# Patient Record
Sex: Male | Born: 1970 | Hispanic: No | Smoking: Current every day smoker
Health system: Southern US, Community
[De-identification: ages and names within clinical notes are randomized; demographics above are authoritative.]

## PROBLEM LIST (undated history)

## (undated) DIAGNOSIS — Q8500 Neurofibromatosis, unspecified: Secondary | ICD-10-CM

## (undated) DIAGNOSIS — F1010 Alcohol abuse, uncomplicated: Secondary | ICD-10-CM

## (undated) DIAGNOSIS — Q8501 Neurofibromatosis, type 1: Secondary | ICD-10-CM

## (undated) DIAGNOSIS — G4730 Sleep apnea, unspecified: Secondary | ICD-10-CM

## (undated) DIAGNOSIS — F8081 Childhood onset fluency disorder: Secondary | ICD-10-CM

## (undated) HISTORY — PX: CARPAL TUNNEL RELEASE: SHX101

---

## 2013-12-03 ENCOUNTER — Ambulatory Visit: Payer: Self-pay | Attending: Family Medicine

## 2014-12-19 ENCOUNTER — Encounter (HOSPITAL_COMMUNITY): Payer: Self-pay

## 2014-12-19 ENCOUNTER — Emergency Department (HOSPITAL_COMMUNITY): Payer: Self-pay

## 2014-12-19 ENCOUNTER — Emergency Department (HOSPITAL_COMMUNITY)
Admission: EM | Admit: 2014-12-19 | Discharge: 2014-12-19 | Disposition: A | Discharge status: Home / Self Care | Payer: Self-pay | Attending: Emergency Medicine | Admitting: Emergency Medicine

## 2014-12-19 DIAGNOSIS — R1084 Generalized abdominal pain: Secondary | ICD-10-CM | POA: Insufficient documentation

## 2014-12-19 DIAGNOSIS — B349 Viral infection, unspecified: Secondary | ICD-10-CM | POA: Insufficient documentation

## 2014-12-19 DIAGNOSIS — Q8501 Neurofibromatosis, type 1: Secondary | ICD-10-CM | POA: Insufficient documentation

## 2014-12-19 HISTORY — DX: Neurofibromatosis, type 1: Q85.01

## 2014-12-19 LAB — COMPREHENSIVE METABOLIC PANEL
ALT: 20 U/L (ref 17–63)
AST: 27 U/L (ref 15–41)
Albumin: 3.6 g/dL (ref 3.5–5.0)
Alkaline Phosphatase: 56 U/L (ref 38–126)
Anion gap: 10 (ref 5–15)
BUN: 10 mg/dL (ref 6–20)
CO2: 28 mmol/L (ref 22–32)
Calcium: 8.4 mg/dL — ABNORMAL LOW (ref 8.9–10.3)
Chloride: 101 mmol/L (ref 101–111)
Creatinine, Ser: 0.68 mg/dL (ref 0.61–1.24)
GFR calc Af Amer: >60 mL/min (ref >60)
GFR calc non Af Amer: >60 mL/min (ref >60)
Glucose, Bld: 114 mg/dL — ABNORMAL HIGH (ref 65–99)
Potassium: 3.4 mmol/L — ABNORMAL LOW (ref 3.5–5.1)
Sodium: 139 mmol/L (ref 135–145)
Total Bilirubin: 0.7 mg/dL (ref 0.3–1.2)
Total Protein: 6.2 g/dL — ABNORMAL LOW (ref 6.5–8.1)

## 2014-12-19 LAB — CBC WITH DIFFERENTIAL/PLATELET
Basophils Absolute: 0 10*3/uL (ref 0.0–0.1)
Basophils Relative: 1 %
Eosinophils Absolute: 0 10*3/uL (ref 0.0–0.7)
Eosinophils Relative: 1 %
HCT: 44.4 % (ref 39.0–52.0)
Hemoglobin: 15.8 g/dL (ref 13.0–17.0)
Lymphocytes Relative: 29 %
Lymphs Abs: 1.4 10*3/uL (ref 0.7–4.0)
MCH: 32.8 pg (ref 26.0–34.0)
MCHC: 35.6 g/dL (ref 30.0–36.0)
MCV: 92.3 fL (ref 78.0–100.0)
Monocytes Absolute: 0.7 10*3/uL (ref 0.1–1.0)
Monocytes Relative: 16 %
Neutro Abs: 2.5 10*3/uL (ref 1.7–7.7)
Neutrophils Relative %: 53 %
Platelets: 229 10*3/uL (ref 150–400)
RBC: 4.81 MIL/uL (ref 4.22–5.81)
RDW: 13 % (ref 11.5–15.5)
WBC: 4.7 10*3/uL (ref 4.0–10.5)

## 2014-12-19 LAB — URINALYSIS, ROUTINE W REFLEX MICROSCOPIC
Bilirubin Urine: NEGATIVE
Glucose, UA: NEGATIVE mg/dL
Hgb urine dipstick: NEGATIVE
Ketones, ur: NEGATIVE mg/dL
Leukocytes, UA: NEGATIVE
Nitrite: NEGATIVE
Protein, ur: 30 mg/dL — AB
Specific Gravity, Urine: 1.016 (ref 1.005–1.030)
pH: 8.5 — ABNORMAL HIGH (ref 5.0–8.0)

## 2014-12-19 LAB — URINE MICROSCOPIC-ADD ON
RBC / HPF: NONE SEEN RBC/hpf (ref 0–5)
Squamous Epithelial / LPF: NONE SEEN
WBC, UA: NONE SEEN WBC/hpf (ref 0–5)

## 2014-12-19 LAB — RAPID URINE DRUG SCREEN, HOSP PERFORMED
Amphetamines: NOT DETECTED
Barbiturates: NOT DETECTED
Benzodiazepines: NOT DETECTED
Cocaine: NOT DETECTED
Opiates: NOT DETECTED
Tetrahydrocannabinol: NOT DETECTED

## 2014-12-19 LAB — I-STAT TROPONIN, ED: Troponin i, poc: 0 ng/mL (ref 0.00–0.08)

## 2014-12-19 LAB — LIPASE, BLOOD: Lipase: 26 U/L (ref 11–51)

## 2014-12-19 MED ORDER — ONDANSETRON HCL 4 MG PO TABS
4.0000 mg | ORAL_TABLET | Freq: Once | ORAL | Status: AC
  Administered 2014-12-19: 4 mg via ORAL
  Filled 2014-12-19: qty 1

## 2014-12-19 MED ORDER — SODIUM CHLORIDE 0.9 % IV BOLUS (SEPSIS)
1000.0000 mL | Freq: Once | INTRAVENOUS | Status: AC
  Administered 2014-12-19: 1000 mL via INTRAVENOUS

## 2014-12-19 MED ORDER — KETOROLAC TROMETHAMINE 15 MG/ML IJ SOLN
15.0000 mg | Freq: Once | INTRAMUSCULAR | Status: AC
  Administered 2014-12-19: 15 mg via INTRAVENOUS
  Filled 2014-12-19: qty 1

## 2014-12-19 MED ORDER — ONDANSETRON HCL 4 MG PO TABS: 4.0000 mg | ORAL_TABLET | Freq: Four times a day (QID) | ORAL | Status: DC

## 2014-12-19 NOTE — ED Notes (Signed)
He c/o cough/intermittent fever, plus occasional n/v/d x 2-3 days.  He arrives in no distress.  He told EMS he drank 1 1/2 "Four Loco" drinks today.  He also c/o body aches, including chest discomfort which is worse with cough.

## 2014-12-19 NOTE — ED Provider Notes (Signed)
CSN: [id number]     Arrival date & time 12/19/14  1642 History   First MD Initiated Contact with Patient 12/19/14 1645     Chief Complaint  Patient presents with  . Cough  . Diarrhea  . Emesis   HPI   44 year old male presents today with numerous complaints. Patient reports that for the last week he's had a nonproductive cough that has persisted. He reports over the last 2 days he's had several episodes of nonbloody and nonprojectile vomiting and diarrhea with subjective fever and chills. He reports generalized abdominal pain. Patient notes that he has been unable to tolerate by mouth but shortly after reports that he had alcohol today and was able to hold that down. Patient additionally notes chest soreness located in the central aspect of his chest that started off very minor and has progressed with associated coughing. He reports that it is centralized with no radiation of symptoms. He reports that he has a history of hypertension, denies smoking, her history, hyperlipidemia, any significant family cardiac history. Patient reports using Tylenol PM with no improvement and chest pain. He reports it's worse with cough, tenderness to palpation.  Past Medical History  Diagnosis Date  . 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 Carpal tunnel release     No family history on file. Social History  Substance Use Topics  . Smoking status: Never Smoker   . Smokeless tobacco: None  . Alcohol Use: Yes    Review of Systems  All other systems reviewed and are negative.   Allergies  Review of patient's allergies indicates no known allergies.  Home Medications   Prior to Admission medications   Medication Sig Start Date End Date Taking? Authorizing Provider  Ibuprofen-Diphenhydramine Cit (ADVIL PM PO) Take 2 tablets by mouth at bedtime as needed (for sleep/pain).   Yes Historical Provider, MD  ondansetron (ZOFRAN) 4 MG tablet Take 1 tablet (4 mg  total) by mouth every 6 (six) hours. 12/19/14   Ingram Onnen, PA-C   BP 144/104 mmHg  Pulse 94  Temp(Src) 97.7 F (36.5 C) (Oral)  Resp 16  SpO2 96%   Physical Exam  Constitutional: He is oriented to person, place, and time. He appears well-developed and well-nourished.  HENT:  Head: Normocephalic and atraumatic.  Eyes: Conjunctivae are normal. Pupils are equal, round, and reactive to light. Right eye exhibits no discharge. Left eye exhibits no discharge. No scleral icterus.  Neck: Normal range of motion. No JVD present. No tracheal deviation present.  Cardiovascular: Regular rhythm, normal heart sounds and intact distal pulses.  Exam reveals no gallop and no friction rub.   No murmur heard. Pulmonary/Chest: Effort normal and breath sounds normal. No stridor. No respiratory distress. He has no wheezes. He has no rales. He exhibits no tenderness.  Abdominal: Soft. Bowel sounds are normal. He exhibits no distension and no mass. There is tenderness. There is no rebound and no guarding.  Generalized abdominal tenderness no focal findings  Musculoskeletal: Normal range of motion. He exhibits no edema or tenderness.  Neurological: He is alert and oriented to person, place, and time. Coordination normal.  Skin: Skin is warm and dry. No rash noted. No erythema. No pallor.  Psychiatric: He has a normal mood and affect. His behavior is normal. Judgment and thought content normal.  Nursing note and vitals reviewed.   ED Course  Procedures (including critical care time) Labs Review Labs Reviewed  COMPREHENSIVE METABOLIC  PANEL - Abnormal; Notable for the following:    Potassium 3.4 (*)    Glucose, Bld 114 (*)    Calcium 8.4 (*)    Total Protein 6.2 (*)    All other components within normal limits  URINALYSIS, ROUTINE W REFLEX MICROSCOPIC (NOT AT [facility]) - Abnormal; Notable for the following:    APPearance TURBID (*)    pH 8.5 (*)    Protein, ur 30 (*)    All other components within normal  limits  URINE MICROSCOPIC-ADD ON - Abnormal; Notable for the following:    Bacteria, UA FEW (*)    All other components within normal limits  CBC WITH DIFFERENTIAL/PLATELET  LIPASE, BLOOD  URINE RAPID DRUG SCREEN, HOSP PERFORMED  I-STAT TROPOININ, ED    Imaging Review Dg Chest 2 View  12/19/2014  CLINICAL DATA:  Cough.  Chest pain.  Symptoms for 2 days. EXAM: CHEST  2 VIEW COMPARISON:  None. FINDINGS: The cardiomediastinal contours are normal. Lung volumes are low with mild bibasilar atelectasis. Pulmonary vasculature is normal. No consolidation, pleural effusion, or pneumothorax. No acute osseous abnormalities are seen. IMPRESSION: Hypoventilatory chest with bibasilar atelectasis. Electronically Signed   By: Rubye OaksMelanie  Ehinger M.D.   On: 12/19/2014 17:58   I have personally reviewed and evaluated these images and lab results as part of my medical decision-making.   EKG Interpretation   Date/Time:  Saturday December 19 2014 16:54:13 EST Ventricular Rate:  94 PR Interval:  152 QRS Duration: 91 QT Interval:  368 QTC Calculation: 460 R Axis:   11 Text Interpretation:  Sinus rhythm Probable left atrial enlargement No  previous tracing Confirmed by Anitra LauthPLUNKETT  MD, Alphonzo LemmingsWHITNEY ([id number]) on 12/19/2014  5:05:10 PM      MDM   Final diagnoses:  Viral illness    Labs: I-STAT troponin, CBC, CMP, lipase, urinalysis, urine rapid drug screen- no significant findings  Imaging: ED EKG, DG chest 2 view- no significant findings Consults:  Therapeutics: Normal saline, Zofran-   Discharge Meds:   Assessment/Plan: Patient presents with likely viral illness. He is afebrile, nontoxic with reassuring vital signs and laboratory data. Patient is tolerating by mouth as evidenced by his ability to drink alcohol today without vomiting. He discharged home with antinausea medication and strict return precautions fall with primary in 3 days for reevaluation. Patient verbalizes understanding and agreement with  today's plan and had no further questions or concerns at time of discharge.        Eyvonne MechanicJeffrey Machael Raine, PA-C 12/19/14 [id number]  Gwyneth SproutWhitney Plunkett, MD 12/20/14 [phone]

## 2014-12-19 NOTE — Discharge Instructions (Signed)
Viral Infections °A viral infection can be caused by different types of viruses. Most viral infections are not serious and resolve on their own. However, some infections may cause severe symptoms and may lead to further complications. °SYMPTOMS °Viruses can frequently cause: °· Minor sore throat. °· Aches and pains. °· Headaches. °· Runny nose. °· Different types of rashes. °· Watery eyes. °· Tiredness. °· Cough. °· Loss of appetite. °· Gastrointestinal infections, resulting in nausea, vomiting, and diarrhea. °These symptoms do not respond to antibiotics because the infection is not caused by bacteria. However, you might catch a bacterial infection following the viral infection. This is sometimes called a "superinfection." Symptoms of such a bacterial infection may include: °· Worsening sore throat with pus and difficulty swallowing. °· Swollen neck glands. °· Chills and a high or persistent fever. °· Severe headache. °· Tenderness over the sinuses. °· Persistent overall ill feeling (malaise), muscle aches, and tiredness (fatigue). °· Persistent cough. °· Yellow, green, or brown mucus production with coughing. °HOME CARE INSTRUCTIONS  °· Only take over-the-counter or prescription medicines for pain, discomfort, diarrhea, or fever as directed by your caregiver. °· Drink enough water and fluids to keep your urine clear or pale yellow. Sports drinks can provide valuable electrolytes, sugars, and hydration. °· Get plenty of rest and maintain proper nutrition. Soups and broths with crackers or rice are fine. °SEEK IMMEDIATE MEDICAL CARE IF:  °· You have severe headaches, shortness of breath, chest pain, neck pain, or an unusual rash. °· You have uncontrolled vomiting, diarrhea, or you are unable to keep down fluids. °· You or your child has an oral temperature above 102° F (38.9° C), not controlled by medicine. °· Your baby is older than 3 months with a rectal temperature of 102° F (38.9° C) or higher. °· Your baby is 3  months old or younger with a rectal temperature of 100.4° F (38° C) or higher. °MAKE SURE YOU:  °· Understand these instructions. °· Will watch your condition. °· Will get help right away if you are not doing well or get worse. °  °This information is not intended to replace advice given to you by your health care provider. Make sure you discuss any questions you have with your health care provider. °  °Document Released: 10/05/2004 Document Revised: 03/20/2011 Document Reviewed: 06/03/2014 °Elsevier Interactive Patient Education ©2016 Elsevier Inc. ° °

## 2014-12-19 NOTE — ED Notes (Signed)
Unable to collect labs PT taken to xray 

## 2014-12-19 NOTE — ED Notes (Signed)
Bed: [id number] Expected date: 12/19/14 Expected time: 4:41 PM Means of arrival: Ambulance Comments: N/V body aches

## 2015-07-17 ENCOUNTER — Encounter (HOSPITAL_COMMUNITY): Payer: Self-pay | Admitting: Emergency Medicine

## 2015-07-17 ENCOUNTER — Emergency Department (HOSPITAL_COMMUNITY)
Admission: EM | Admit: 2015-07-17 | Discharge: 2015-07-18 | Disposition: A | Discharge status: Home / Self Care | Payer: Self-pay | Attending: Emergency Medicine | Admitting: Emergency Medicine

## 2015-07-17 DIAGNOSIS — F1020 Alcohol dependence, uncomplicated: Secondary | ICD-10-CM | POA: Diagnosis present

## 2015-07-17 DIAGNOSIS — F322 Major depressive disorder, single episode, severe without psychotic features: Secondary | ICD-10-CM | POA: Diagnosis present

## 2015-07-17 DIAGNOSIS — Z5181 Encounter for therapeutic drug level monitoring: Secondary | ICD-10-CM | POA: Insufficient documentation

## 2015-07-17 DIAGNOSIS — F1094 Alcohol use, unspecified with alcohol-induced mood disorder: Secondary | ICD-10-CM | POA: Insufficient documentation

## 2015-07-17 HISTORY — DX: Sleep apnea, unspecified: G47.30

## 2015-07-17 LAB — COMPREHENSIVE METABOLIC PANEL
ALBUMIN: 3.9 g/dL (ref 3.5–5.0)
ALK PHOS: 61 U/L (ref 38–126)
ALT: 42 U/L (ref 17–63)
AST: 38 U/L (ref 15–41)
Anion gap: 8 (ref 5–15)
BILIRUBIN TOTAL: 0.1 mg/dL — AB (ref 0.3–1.2)
BUN: 8 mg/dL (ref 6–20)
CALCIUM: 8.5 mg/dL — AB (ref 8.9–10.3)
CO2: 21 mmol/L — ABNORMAL LOW (ref 22–32)
Chloride: 108 mmol/L (ref 101–111)
Creatinine, Ser: 0.83 mg/dL (ref 0.61–1.24)
GFR calc Af Amer: >60 mL/min (ref >60)
GFR calc non Af Amer: >60 mL/min (ref >60)
GLUCOSE: 108 mg/dL — AB (ref 65–99)
Potassium: 3.5 mmol/L (ref 3.5–5.1)
Sodium: 137 mmol/L (ref 135–145)
TOTAL PROTEIN: 6.5 g/dL (ref 6.5–8.1)

## 2015-07-17 LAB — CBC
HEMATOCRIT: 50.4 % (ref 39.0–52.0)
Hemoglobin: 17.8 g/dL — ABNORMAL HIGH (ref 13.0–17.0)
MCH: 30.6 pg (ref 26.0–34.0)
MCHC: 35.3 g/dL (ref 30.0–36.0)
MCV: 86.7 fL (ref 78.0–100.0)
Platelets: 332 10*3/uL (ref 150–400)
RBC: 5.81 MIL/uL (ref 4.22–5.81)
RDW: 14.2 % (ref 11.5–15.5)
WBC: 9.8 10*3/uL (ref 4.0–10.5)

## 2015-07-17 LAB — ACETAMINOPHEN LEVEL: Acetaminophen (Tylenol), Serum: <10 ug/mL — ABNORMAL LOW (ref 10–30)

## 2015-07-17 LAB — RAPID URINE DRUG SCREEN, HOSP PERFORMED
Amphetamines: NOT DETECTED
BARBITURATES: NOT DETECTED
BENZODIAZEPINES: NOT DETECTED
Cocaine: NOT DETECTED
Opiates: NOT DETECTED
TETRAHYDROCANNABINOL: NOT DETECTED

## 2015-07-17 LAB — ETHANOL: Alcohol, Ethyl (B): 386 mg/dL (ref <5)

## 2015-07-17 LAB — SALICYLATE LEVEL: Salicylate Lvl: <4 mg/dL (ref 2.8–30.0)

## 2015-07-17 MED ORDER — LORAZEPAM 1 MG PO TABS: 0.0000 mg | ORAL_TABLET | Freq: Two times a day (BID) | ORAL | Status: DC

## 2015-07-17 MED ORDER — LORAZEPAM 1 MG PO TABS
0.0000 mg | ORAL_TABLET | Freq: Four times a day (QID) | ORAL | Status: DC
  Administered 2015-07-18: 2 mg via ORAL
  Administered 2015-07-18: 1 mg via ORAL
  Filled 2015-07-17: qty 1
  Filled 2015-07-17: qty 2

## 2015-07-17 NOTE — BH Assessment (Addendum)
Tele Assessment Note   Levi Welch is an 45 y.o. single male who presents unaccompanied to [facility] ED voluntarily via Patent examinerlaw enforcement. Pt is very intoxicated, tearful and says "I tried to kill myself today." Pt reports he ingested eight tabs of Valerian root and drank an excessive amount of alcohol in a suicide attempt. Pt reports he filed a complaint against his landlord and is in the process of being evicted. Pt fears he will be homeless and says "I'm scared and ashamed." He states he has a long history of alcohol abuse. Pt reports symptoms including crying spells, social withdrawal, loss of interest in usual pleasures, fatigue, irritability, decreased concentration, decreased sleep, decreased appetite and feelings of guilt and hopelessness. Pt denies any previous suicide attempts. He denies any history of intentional self-injurious behaviors. He denies any history of psychotic symptoms.  Pt reports he has been abusing alcohol for years and is unable to maintain sobriety. He states he drinks approximately three 40-ounce bottles of "[employer]" daily. Pt denies other substance use. Pt reports a history of blackouts and withdrawal symptoms including tremors, sweats, nausea, vomiting, diarrhea and insomnia. He denies history of seizures. Pt's blood alcohol is 386 and his urine drug screen is negative.  Pt identifies his housing situation as his primary stressor. He says he currently rents a residence with three roommates. 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 He says he is "the black sheep of my family" and doesn't feel he has any support at this time. He says his mother abused alcohol and died when Pt was age 43eighteen of alcohol related causes. He says his father is 39seventy-five years old and still working and he has four siblings. Pt reports he [facility] and his most recent treatment was in 2015.  Pt is dressed in  hospital scrubs, alert, intoxicated, oriented x4. Pt has significant stutter. Motor behavior is slightly tremulous. Eye contact is good and Pt is very tearful. Pt's mood is depressed and affect is congruent with mood. Thought process is coherent and relevant. There is no indication Pt is currently responding to internal stimuli or experiencing delusional thought content. Pt was cooperative throughout assessment. He states he is willing to sign voluntarily into a psychiatric facility to address his alcohol use and depressed mood.   Diagnosis: Alcohol Use Disorder, Severe; Alcohol-induced Mood Disorder  Past Medical History:  Past Medical History  Diagnosis Date  . 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 Sleep apnea     Past Surgical History  Procedure Laterality Date  . Carpal tunnel release      Family History: No family history on file.  Social History:  reports that he has never smoked. He does not have any smokeless tobacco history on file. He reports that he drinks alcohol. He reports that he does not use illicit drugs.  Additional Social History:  Alcohol / Drug Use Pain Medications: Denies abuse Prescriptions: Denies abuse Over the Counter: Denies abuse History of alcohol / drug use?: Yes Longest period of sobriety (when/how long): Six days Negative Consequences of Use: Personal relationships Withdrawal Symptoms: Tremors, Sweats, Nausea / Vomiting, Blackouts, Diarrhea Substance #1 Name of Substance 1: Alcohol 1 - Age of First Use: 15 1 - Amount (size/oz): Approximately three 40-ounce "Ice House" 1 - Frequency: Daily  1 - Duration: Ongoing 1 - Last Use / Amount: 07/17/15, three 40-ounce "Ice House"  CIWA: CIWA-Ar BP: Marland Kitchen(!)  144/110 mmHg Pulse Rate: 88 Nausea and Vomiting: no nausea and no vomiting Tactile Disturbances: none Tremor: two Auditory Disturbances: not present Paroxysmal Sweats: no sweat visible Visual Disturbances: not present Anxiety: three Headache, Fullness  in Head: none present Agitation: normal activity Orientation and Clouding of Sensorium: oriented and can do serial additions CIWA-Ar Total: 5 COWS:    PATIENT STRENGTHS: (choose at least two) Ability for insight Average or above average intelligence Capable of independent living Metallurgist fund of knowledge Motivation for treatment/growth Physical Health Work skills  Allergies: No Known Allergies  Home Medications:  (Not in a hospital admission)  OB/GYN Status:  No LMP for male patient.  General Assessment Data Location of Assessment: [facility] ED TTS Assessment: In system Is this a Tele or Face-to-Face Assessment?: Face-to-Face Is this an Initial Assessment or a Re-assessment for this encounter?: Initial Assessment Marital status: Single Maiden name: NA Is patient pregnant?: No Pregnancy Status: No Living Arrangements: Other (Comment) (In process of being evicted) Can pt return to current living arrangement?: No Admission Status: Voluntary Is patient capable of signing voluntary admission?: Yes Referral Source: Self/Family/Friend Insurance type: Self-pay     Crisis Care Plan Living Arrangements: Other (Comment) (In process of being evicted) Legal Guardian: Other: (Self) Name of Psychiatrist: None Name of Therapist: None  Education Status Is patient currently in school?: No Current Grade: NA Highest grade of school patient has completed: GED Name of school: NA Contact person: NA  Risk to self with the past 6 months Suicidal Ideation: Yes-Currently Present Has patient been a risk to self within the past 6 months prior to admission? : Yes Suicidal Intent: Yes-Currently Present Has patient had any suicidal intent within the past 6 months prior to admission? : Yes Is patient at risk for suicide?: Yes Suicidal Plan?: Yes-Currently Present Has patient had any suicidal plan within the past 6 months prior to admission? : Yes Specify  Current Suicidal Plan: Pt reports he took 8 tabs of valerian root and alcohol in suicide attempt Access to Means: Yes Specify Access to Suicidal Means: Access to OTC medications What has been your use of drugs/alcohol within the last 12 months?: Pt reports he abuses alcohol Previous Attempts/Gestures: No How many times?: 0 Other Self Harm Risks: None Triggers for Past Attempts: None known Intentional Self Injurious Behavior: None Family Suicide History: No Recent stressful life event(s): Financial Problems, Other (Comment) (Eviction from residence) Persecutory voices/beliefs?: No Depression: Yes Depression Symptoms: Despondent, Insomnia, Tearfulness, Isolating, Fatigue, Guilt, Loss of interest in usual pleasures, Feeling worthless/self pity, Feeling angry/irritable Substance abuse history and/or treatment for substance abuse?: Yes Suicide prevention information given to non-admitted patients: Not applicable  Risk to Others within the past 6 months Homicidal Ideation: No Does patient have any lifetime risk of violence toward others beyond the six months prior to admission? : No Thoughts of Harm to Others: No Current Homicidal Intent: No Current Homicidal Plan: No Access to Homicidal Means: No Identified Victim: None History of harm to others?: No Assessment of Violence: None Noted Violent Behavior Description: Pt denies history of violence Does patient have access to weapons?: No Criminal Charges Pending?: No Does patient have a court date: No Is patient on probation?: No  Psychosis Hallucinations: None noted Delusions: None noted  Mental Status Report Appearance/Hygiene: In scrubs Eye Contact: Good Motor Activity: Tremors Speech: Other (Comment) (Stutter) Level of Consciousness: Alert Mood: Depressed, Ashamed/humiliated, Anxious Affect: Depressed, Sad Anxiety Level: Moderate Thought Processes: Coherent, Relevant Judgement: Partial Orientation:  Person, Place, Time,  Situation, Appropriate for developmental age Obsessive Compulsive Thoughts/Behaviors: None  Cognitive Functioning Concentration: Fair Memory: Recent Intact, Remote Intact IQ: Average Insight: Fair Impulse Control: Fair Appetite: Fair Weight Loss: 0 Weight Gain: 0 Sleep: No Change Total Hours of Sleep: 7 Vegetative Symptoms: None  ADLScreening [facility] Assessment Services) Patient's cognitive ability adequate to safely complete daily activities?: Yes Patient able to express need for assistance with ADLs?: Yes Independently performs ADLs?: Yes (appropriate for developmental age)  Prior Inpatient Therapy Prior Inpatient Therapy: Yes Prior Therapy Dates: 2015 Prior Therapy Facilty/Provider(s): [facility] Reason for Treatment: Alcohol abuse  Prior Outpatient Therapy Prior Outpatient Therapy: No Prior Therapy Dates: NA Prior Therapy Facilty/Provider(s): NA Reason for Treatment: NA Does patient have an ACCT team?: No Does patient have Intensive In-House Services?  : No Does patient have [facility] services? : No Does patient have P4CC services?: No  ADL Screening (condition at time of admission) Patient's cognitive ability adequate to safely complete daily activities?: Yes Is the patient deaf or have difficulty hearing?: No Does the patient have difficulty seeing, even when wearing glasses/contacts?: No Does the patient have difficulty concentrating, remembering, or making decisions?: No Patient able to express need for assistance with ADLs?: Yes Does the patient have difficulty dressing or bathing?: No Independently performs ADLs?: Yes (appropriate for developmental age) Does the patient have difficulty walking or climbing stairs?: No Weakness of Legs: None Weakness of Arms/Hands: None  Home Assistive Devices/Equipment Home Assistive Devices/Equipment: None    Abuse/Neglect Assessment (Assessment to be complete while patient is alone) Physical Abuse: Denies Verbal Abuse:  Denies Sexual Abuse: Denies Exploitation of patient/patient's resources: Denies Self-Neglect: Denies     Merchant navy officer (For Healthcare) Does patient have an advance directive?: No Would patient like information on creating an advanced directive?: No - patient declined information    Additional Information 1:1 In Past 12 Months?: No CIRT Risk: No Elopement Risk: No Does patient have medical clearance?: Yes     Disposition: Orlean Patten, AC at [facility], confirmed adult unit is at capacity. Gave clinical report to Maryjean Morn, PA who said Pt meets criteria for inpatient dual-diagnosis treatment. TTS will contact other facilities for placement when Pt's blood alcohol is under 200. Notified Antony Madura, PA and Donneta Romberg, RN of recommendation.   Disposition Initial Assessment Completed for this Encounter: Yes Disposition of Patient: Inpatient treatment program Type of inpatient treatment program: Adult  Pamalee Leyden, [facility], [facility], [place] [facility] Triage Specialist [phone]   Pamalee Leyden 07/17/2015 10:09 PM

## 2015-07-17 NOTE — ED Notes (Signed)
Belongings secured in locker #31.

## 2015-07-17 NOTE — ED Provider Notes (Signed)
CSN: [id number]     Arrival date & time 07/17/15  2045 History  By signing my name below, I, Bridgette Habermann, attest that this documentation has been prepared under the direction and in the presence of [employer], PA-C. Electronically Signed: Bridgette Habermann, ED Scribe. 07/17/2015. 9:33 PM.   Chief Complaint  Patient presents with  . Suicidal   The history is provided by the patient. No language interpreter was used.   HPI Comments: Levi Welch is a 45 y.o. male who presents to the Emergency Department brought in by [facility] complaining of suicidal ideations starting 3 weeks ago. Pt says he broke his thumb then and had issues with his landlord after filing a complaint and is now being evicted. Pt notes he is afraid of being homeless and that is one of the major reasons why he is feeling suicidal. Pt notes he is a frequent alcohol abuser and has drank 3 44oz of Icehouse today. He has tried to stop drinking several times without success. He states that he plans on committing suicide by "downing" an OTC sleep medicine with alcohol. Pt has no history of seizures but does experience tremors.    Past Medical History  Diagnosis Date  . 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 Sleep apnea    Past Surgical History  Procedure Laterality Date  . Carpal tunnel release     No family history on file. Social History  Substance Use Topics  . Smoking status: Never Smoker   . Smokeless tobacco: None  . Alcohol Use: Yes    Review of Systems  Constitutional: Negative for fever.  Neurological: Positive for tremors.  Psychiatric/Behavioral: Positive for suicidal ideas. The patient is nervous/anxious.   All other systems reviewed and are negative.   Allergies  Review of patient's allergies indicates no known allergies.  Home Medications   Prior to Admission medications   Not on File   BP 142/94 mmHg  Pulse 94  Temp(Src) 97.7 F (36.5 C) (Oral)  Resp 17  Ht  (1.651 m)  Wt 70.308 kg  BMI 25.79 kg/m2   SpO2 99%   Physical Exam  Constitutional: He is oriented to person, place, and time. He appears well-developed and well-nourished. No distress.  HENT:  Head: Normocephalic and atraumatic.  Eyes: Conjunctivae and EOM are normal. No scleral icterus.  Neck: Normal range of motion.  Cardiovascular: Normal rate, regular rhythm and intact distal pulses.   Pulmonary/Chest: Effort normal. No respiratory distress.  Musculoskeletal: Normal range of motion.  Neurological: He is alert and oriented to person, place, and time. He exhibits normal muscle tone. Coordination normal.  Skin: Skin is warm and dry. No rash noted. He is not diaphoretic. No erythema. No pallor.  Psychiatric: He has a normal mood and affect. His behavior is normal. His speech is delayed (stutter present). He expresses suicidal ideation. He expresses no homicidal ideation. He expresses no homicidal plans.  Calm and cooperative  Nursing note and vitals reviewed.   ED Course  Procedures  DIAGNOSTIC STUDIES: Oxygen Saturation is 98% on RA, normal by my interpretation.    COORDINATION OF CARE: 9:32 PM Discussed treatment plan with pt at bedside which includes x-ray of thumb and counselor consultation and pt agreed to plan.  Labs Review Labs Reviewed  COMPREHENSIVE METABOLIC PANEL - Abnormal; Notable for the following:    CO2 21 (*)    Glucose, Bld 108 (*)    Calcium 8.5 (*)    Total Bilirubin 0.1 (*)  All other components within normal limits  ETHANOL - Abnormal; Notable for the following:    Alcohol, Ethyl (B) 386 (*)    All other components within normal limits  ACETAMINOPHEN LEVEL - Abnormal; Notable for the following:    Acetaminophen (Tylenol), Serum <10 (*)    All other components within normal limits  CBC - Abnormal; Notable for the following:    Hemoglobin 17.8 (*)    All other components within normal limits  SALICYLATE LEVEL  URINE RAPID DRUG SCREEN, HOSP PERFORMED    Imaging Review No results found. I  have personally reviewed and evaluated these images and lab results as part of my medical decision-making.   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 He has been medically cleared and was clinically sober on arrival despite BAC of 386. TTS recommend inpatient treatment; [facility], so seeking [facility]. Disposition to be determined by oncoming ED provider.  I personally performed the services described in this documentation, which was scribed in my presence. The recorded information has been reviewed and is accurate.      Antony MaduraKelly Beverely Suen, PA-C 07/18/15 0453  Lorre NickAnthony Allen, MD 07/23/15 [phone]

## 2015-07-17 NOTE — ED Notes (Addendum)
Pt transported by [employer] from his apartment pt tearful, stating he wants to kill himself. Pt states he is having issues with landlord and is being evicted. Pt states he is fearful of being homeless. Pt had plan to overdose with pills and etoh. Pt states he has been drinking all day today.  Pt states he drinks 4 40oz Icehouse everyday. Pt states he has attempted to stop drinking several times with and without rehab without success. Pt denies seizure activity but does experience tremors. Last drink 2045 tonight. Anxiety and tremors noted at this time.

## 2015-07-17 NOTE — ED Notes (Signed)
Bed: [id number] Expected date:  Expected time:  Means of arrival:  Comments: T4

## 2015-07-17 NOTE — ED Notes (Signed)
Pt given scrubs to change.

## 2015-07-18 ENCOUNTER — Encounter (HOSPITAL_COMMUNITY): Payer: Self-pay | Admitting: *Deleted

## 2015-07-18 ENCOUNTER — Inpatient Hospital Stay (HOSPITAL_COMMUNITY)
Admission: AD | Admit: 2015-07-18 | Discharge: 2015-07-26 | DRG: 885 | Disposition: A | Discharge status: Home / Self Care | Payer: Federal, State, Local not specified - Other | Source: Intra-hospital | Attending: Psychiatry | Admitting: Psychiatry

## 2015-07-18 DIAGNOSIS — F332 Major depressive disorder, recurrent severe without psychotic features: Secondary | ICD-10-CM | POA: Diagnosis present

## 2015-07-18 DIAGNOSIS — R45851 Suicidal ideations: Secondary | ICD-10-CM

## 2015-07-18 DIAGNOSIS — Y908 Blood alcohol level of 240 mg/100 ml or more: Secondary | ICD-10-CM | POA: Diagnosis present

## 2015-07-18 DIAGNOSIS — F1020 Alcohol dependence, uncomplicated: Secondary | ICD-10-CM | POA: Diagnosis present

## 2015-07-18 DIAGNOSIS — F10239 Alcohol dependence with withdrawal, unspecified: Secondary | ICD-10-CM | POA: Diagnosis present

## 2015-07-18 DIAGNOSIS — F322 Major depressive disorder, single episode, severe without psychotic features: Secondary | ICD-10-CM | POA: Diagnosis present

## 2015-07-18 MED ORDER — VITAMIN B-1 100 MG PO TABS
100.0000 mg | ORAL_TABLET | Freq: Every day | ORAL | Status: DC
  Administered 2015-07-19 – 2015-07-26 (×8): 100 mg via ORAL
  Filled 2015-07-18 (×11): qty 1

## 2015-07-18 MED ORDER — HYDROXYZINE HCL 25 MG PO TABS
25.0000 mg | ORAL_TABLET | Freq: Four times a day (QID) | ORAL | Status: AC | PRN
  Administered 2015-07-18 – 2015-07-21 (×3): 25 mg via ORAL
  Filled 2015-07-18 (×3): qty 1

## 2015-07-18 MED ORDER — FLUOXETINE HCL 10 MG PO CAPS
10.0000 mg | ORAL_CAPSULE | Freq: Every day | ORAL | Status: DC
Start: 2015-07-18 — End: 2015-07-18
  Administered 2015-07-18: 10 mg via ORAL
  Filled 2015-07-18: qty 1

## 2015-07-18 MED ORDER — LORAZEPAM 1 MG PO TABS
1.0000 mg | ORAL_TABLET | Freq: Four times a day (QID) | ORAL | Status: AC | PRN
  Administered 2015-07-18 – 2015-07-20 (×3): 1 mg via ORAL
  Filled 2015-07-18 (×3): qty 1

## 2015-07-18 MED ORDER — ALUM & MAG HYDROXIDE-SIMETH 200-200-20 MG/5ML PO SUSP: 30.0000 mL | ORAL | Status: DC | PRN

## 2015-07-18 MED ORDER — ONDANSETRON 4 MG PO TBDP: 4.0000 mg | ORAL_TABLET | Freq: Four times a day (QID) | ORAL | Status: AC | PRN

## 2015-07-18 MED ORDER — ACETAMINOPHEN 325 MG PO TABS: 650.0000 mg | ORAL_TABLET | Freq: Four times a day (QID) | ORAL | Status: DC | PRN

## 2015-07-18 MED ORDER — LORAZEPAM 1 MG PO TABS: 0.0000 mg | ORAL_TABLET | Freq: Two times a day (BID) | ORAL | Status: DC

## 2015-07-18 MED ORDER — LORAZEPAM 1 MG PO TABS
1.0000 mg | ORAL_TABLET | Freq: Three times a day (TID) | ORAL | Status: AC
  Administered 2015-07-20 (×3): 1 mg via ORAL
  Filled 2015-07-18 (×3): qty 1

## 2015-07-18 MED ORDER — LOPERAMIDE HCL 2 MG PO CAPS: 2.0000 mg | ORAL_CAPSULE | ORAL | Status: AC | PRN

## 2015-07-18 MED ORDER — TRAZODONE HCL 50 MG PO TABS
50.0000 mg | ORAL_TABLET | Freq: Every evening | ORAL | Status: DC | PRN
Start: 2015-07-18 — End: 2015-07-20
  Administered 2015-07-19: 50 mg via ORAL
  Filled 2015-07-18: qty 1

## 2015-07-18 MED ORDER — FLUOXETINE HCL 10 MG PO CAPS
10.0000 mg | ORAL_CAPSULE | Freq: Every day | ORAL | Status: DC
  Administered 2015-07-19 – 2015-07-26 (×8): 10 mg via ORAL
  Filled 2015-07-18: qty 7
  Filled 2015-07-18 (×10): qty 1

## 2015-07-18 MED ORDER — LORAZEPAM 1 MG PO TABS
1.0000 mg | ORAL_TABLET | Freq: Four times a day (QID) | ORAL | Status: AC
  Administered 2015-07-18 – 2015-07-19 (×5): 1 mg via ORAL
  Filled 2015-07-18 (×5): qty 1

## 2015-07-18 MED ORDER — LORAZEPAM 1 MG PO TABS
1.0000 mg | ORAL_TABLET | Freq: Every day | ORAL | Status: AC
  Administered 2015-07-22: 1 mg via ORAL
  Filled 2015-07-18: qty 1

## 2015-07-18 MED ORDER — ADULT MULTIVITAMIN W/MINERALS CH
1.0000 | ORAL_TABLET | Freq: Every day | ORAL | Status: DC
  Administered 2015-07-18 – 2015-07-26 (×9): 1 via ORAL
  Filled 2015-07-18 (×13): qty 1

## 2015-07-18 MED ORDER — MAGNESIUM HYDROXIDE 400 MG/5ML PO SUSP: 30.0000 mL | Freq: Every day | ORAL | Status: DC | PRN

## 2015-07-18 MED ORDER — LORAZEPAM 1 MG PO TABS
1.0000 mg | ORAL_TABLET | Freq: Two times a day (BID) | ORAL | Status: AC
  Administered 2015-07-21 (×2): 1 mg via ORAL
  Filled 2015-07-18 (×2): qty 1

## 2015-07-18 MED ORDER — LORAZEPAM 1 MG PO TABS: 0.0000 mg | ORAL_TABLET | Freq: Four times a day (QID) | ORAL | Status: DC

## 2015-07-18 NOTE — Progress Notes (Signed)
Patient stated that he has been off work the last three days. "Judie GrieveBryan (roommate) has been getting drunk, and that he broke his right thumb trying to pick him up." He went to his landlord to complain about what happen. He stated that the landlord asked him to leave. He is now homeless, and decided that he would kill himself because of it. He had an active plan stating that he was going to take a handful of OTC sleeping pills with beer. 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 He is wearing partial in his mouth. Skin was unremarkable. He stated that he still was feeling SI, but denied HI and AVH. He is cooperative and calm. He stammers when speaking.   Patient remained safe with q 15 min checks, he was offered support, encouragement, and education.   Patient is receptive and cooperative, will continue to monitor.

## 2015-07-18 NOTE — Progress Notes (Signed)
Patient attended AA group tonight. 

## 2015-07-18 NOTE — BH Assessment (Signed)
[facility] by Dr. Jannifer FranklinAkintayo and Nanine MeansJamison Lord, DNP. Room assignment is 300-2. Nursing report [phone]. Support paperwork completed. [facility].

## 2015-07-18 NOTE — ED Notes (Signed)
Patient is tearful and states he is unable to rest.  Patient is anxious, has sweaty palms, and tremors noted.

## 2015-07-18 NOTE — Consult Note (Signed)
[facility] Consult   Reason for Consult:  Alcohol dependence with suicidal ideations Referring Physician:  EDP Patient Identification: Levi Welch MRN:  [id number] Principal Diagnosis: MDD (major depressive disorder), severe ([place]) Diagnosis:   Patient Active Problem List   Diagnosis Date Noted  . Alcohol use disorder, severe, dependence ([place]) [F10.20] 07/18/2015    Priority: High  . MDD (major depressive disorder), severe ([place]) [F32.2] 07/18/2015    Priority: High  . Neurofibromatosis, type 1 ([place]) [Q85.01]     Total Time spent with patient: 45 minutes  Subjective:   Levi Welch is a 45 y.o. male patient admitted with suicide plan to overdose.  HPI:  45 yo male who presented to the ED by police for suicidal ideations with a plan to overdose.  He has been drinking daily for the past six months and is in jeopardy of being evicted and losing his job.  He denies hallucinations and homicidal ideations.  Tremors on assessment from alcohol withdrawal.  He has never been inpatient but was in rehab 2 years ago and stayed sober until six months ago.    Past Psychiatric History: depression, alcohol dependence  Risk to Self: Suicidal Ideation: Yes-Currently Present Suicidal Intent: Yes-Currently Present Is patient at risk for suicide?: Yes Suicidal Plan?: Yes-Currently Present Specify Current Suicidal Plan: Pt reports he took 8 tabs of valerian root and alcohol in suicide attempt Access to Means: Yes Specify Access to Suicidal Means: Access to OTC medications What has been your use of drugs/alcohol within the last 12 months?: Pt reports he abuses alcohol How many times?: 0 Other Self Harm Risks: None Triggers for Past Attempts: None known Intentional Self Injurious Behavior: None Risk to Others: Homicidal Ideation: No Thoughts of Harm to Others: No Current Homicidal Intent: No Current Homicidal Plan: No Access to Homicidal Means: No Identified Victim:  None History of harm to others?: No Assessment of Violence: None Noted Violent Behavior Description: Pt denies history of violence Does patient have access to weapons?: No Criminal Charges Pending?: No Does patient have a court date: No Prior Inpatient Therapy: Prior Inpatient Therapy: Yes Prior Therapy Dates: 2015 Prior Therapy Facilty/Provider(s): [facility] Reason for Treatment: Alcohol abuse Prior Outpatient Therapy: Prior Outpatient Therapy: No Prior Therapy Dates: NA Prior Therapy Facilty/Provider(s): NA Reason for Treatment: NA Does patient have an ACCT team?: No Does patient have Intensive In-House Services?  : No Does patient have [facility] services? : No Does patient have P4CC services?: No  Past Medical History:  Past Medical History  Diagnosis Date  . Neurofibromatosis, type 1 ([place])   . Sleep apnea     Past Surgical History  Procedure Laterality Date  . Carpal tunnel release     Family History: No family history on file. Family Psychiatric  History: none Social History:  History  Alcohol Use  . Yes     History  Drug Use No    Social History   Social History  . Marital Status: Single    Spouse Name: N/A  . Number of Children: N/A  . Years of Education: N/A   Social History Main Topics  . Smoking status: Never Smoker   . Smokeless tobacco: None  . Alcohol Use: Yes  . Drug Use: No  . Sexual Activity: Not Asked   Other Topics Concern  . None   Social History Narrative   Additional Social History:    Allergies:  No Known Allergies  Labs:  Results for orders placed or performed during the  hospital encounter of 07/17/15 (from the past 48 hour(s))  Comprehensive metabolic panel     Status: Abnormal   Collection Time: 07/17/15  9:20 PM  Result Value Ref Range   Sodium 137 135 - 145 mmol/L   Potassium 3.5 3.5 - 5.1 mmol/L   Chloride 108 101 - 111 mmol/L   CO2 21 (L) 22 - 32 mmol/L   Glucose, Bld 108 (H) 65 - 99 mg/dL   BUN 8 6 - 20 mg/dL    Creatinine, Ser 0.83 0.61 - 1.24 mg/dL   Calcium 8.5 (L) 8.9 - 10.3 mg/dL   Total Protein 6.5 6.5 - 8.1 g/dL   Albumin 3.9 3.5 - 5.0 g/dL   AST 38 15 - 41 U/L   ALT 42 17 - 63 U/L   Alkaline Phosphatase 61 38 - 126 U/L   Total Bilirubin 0.1 (L) 0.3 - 1.2 mg/dL   GFR calc non Af Amer >60 >60 mL/min   GFR calc Af Amer >60 >60 mL/min    Comment: (NOTE) The eGFR has been calculated using the CKD EPI equation. This calculation has not been validated in all clinical situations. eGFR's persistently <60 mL/min signify possible Chronic Kidney Disease.    Anion gap 8 5 - 15  Ethanol     Status: Abnormal   Collection Time: 07/17/15  9:20 PM  Result Value Ref Range   Alcohol, Ethyl (B) 386 (HH) <5 mg/dL    Comment:        LOWEST DETECTABLE LIMIT FOR SERUM ALCOHOL IS 5 mg/dL FOR MEDICAL PURPOSES ONLY CRITICAL RESULT CALLED TO, READ BACK BY AND VERIFIED WITH: A DENNIS RN 2202 85/63/14 A NAVARRO   Salicylate level     Status: None   Collection Time: 07/17/15  9:20 PM  Result Value Ref Range   Salicylate Lvl <9.7 2.8 - 30.0 mg/dL  Acetaminophen level     Status: Abnormal   Collection Time: 07/17/15  9:20 PM  Result Value Ref Range   Acetaminophen (Tylenol), Serum <10 (L) 10 - 30 ug/mL    Comment:        THERAPEUTIC CONCENTRATIONS VARY SIGNIFICANTLY. A RANGE OF 10-30 ug/mL MAY BE AN EFFECTIVE CONCENTRATION FOR MANY PATIENTS. HOWEVER, SOME ARE BEST TREATED AT CONCENTRATIONS OUTSIDE THIS RANGE. ACETAMINOPHEN CONCENTRATIONS >150 ug/mL AT 4 HOURS AFTER INGESTION AND >50 ug/mL AT 12 HOURS AFTER INGESTION ARE OFTEN ASSOCIATED WITH TOXIC REACTIONS.   cbc     Status: Abnormal   Collection Time: 07/17/15  9:20 PM  Result Value Ref Range   WBC 9.8 4.0 - 10.5 K/uL   RBC 5.81 4.22 - 5.81 MIL/uL   Hemoglobin 17.8 (H) 13.0 - 17.0 g/dL   HCT 50.4 39.0 - 52.0 %   MCV 86.7 78.0 - 100.0 fL   MCH 30.6 26.0 - 34.0 pg   MCHC 35.3 30.0 - 36.0 g/dL   RDW 14.2 11.5 - 15.5 %   Platelets 332  150 - 400 K/uL  Rapid urine drug screen (hospital performed)     Status: None   Collection Time: 07/17/15 11:39 PM  Result Value Ref Range   Opiates NONE DETECTED NONE DETECTED   Cocaine NONE DETECTED NONE DETECTED   Benzodiazepines NONE DETECTED NONE DETECTED   Amphetamines NONE DETECTED NONE DETECTED   Tetrahydrocannabinol NONE DETECTED NONE DETECTED   Barbiturates NONE DETECTED NONE DETECTED    Comment:        DRUG SCREEN FOR MEDICAL PURPOSES ONLY.  IF CONFIRMATION IS NEEDED FOR ANY  PURPOSE, NOTIFY LAB WITHIN 5 DAYS.        LOWEST DETECTABLE LIMITS FOR URINE DRUG SCREEN Drug Class       Cutoff (ng/mL) Amphetamine      1000 Barbiturate      200 Benzodiazepine   440 Tricyclics       102 Opiates          300 Cocaine          300 THC              50     Current Facility-Administered Medications  Medication Dose Route Frequency Provider Last Rate Last Dose  . FLUoxetine (PROZAC) capsule 10 mg  10 mg Oral Daily Omarion Minnehan, MD   10 mg at 07/18/15 1157  . LORazepam (ATIVAN) tablet 0-4 mg  0-4 mg Oral Q6H Antonietta Breach, PA-C   2 mg at 07/18/15 1028   Followed by  . [START ON 07/20/2015] LORazepam (ATIVAN) tablet 0-4 mg  0-4 mg Oral Q12H Antonietta Breach, PA-C       No current outpatient prescriptions on file.    Musculoskeletal: Strength & Muscle Tone: within normal limits Gait & Station: normal Patient leans: N/A  Psychiatric Specialty Exam: Physical Exam  Constitutional: He is oriented to person, place, and time. He appears well-developed and well-nourished.  HENT:  Head: Normocephalic.  Neck: Normal range of motion.  Respiratory: Effort normal.  Musculoskeletal: Normal range of motion.  Neurological: He is alert and oriented to person, place, and time.  Skin: Skin is warm and dry.  Psychiatric: His speech is normal and behavior is normal. Judgment normal. His mood appears anxious. Cognition and memory are normal. He exhibits a depressed mood. He expresses suicidal  ideation. He expresses suicidal plans.    Review of Systems  Constitutional: Negative.   HENT: Negative.   Eyes: Negative.   Respiratory: Negative.   Cardiovascular: Negative.   Gastrointestinal: Negative.   Genitourinary: Negative.   Musculoskeletal: Negative.   Skin: Negative.   Neurological: Negative.   Endo/Heme/Allergies: Negative.   Psychiatric/Behavioral: Positive for depression, suicidal ideas and substance abuse. The patient is nervous/anxious.     Blood pressure 159/101, pulse 106, temperature 98 F (36.7 C), temperature source Oral, resp. rate 16, height 5' 5"  (1.651 m), weight 70.308 kg (155 lb), SpO2 98 %.Body mass index is 25.79 kg/(m^2).  General Appearance: Disheveled  Eye Contact:  Good  Speech:  Normal Rate  Volume:  Normal  Mood:  Anxious and Depressed  Affect:  Congruent  Thought Process:  Coherent and Descriptions of Associations: Intact  Orientation:  Full (Time, Place, and Person)  Thought Content:  Rumination  Suicidal Thoughts:  Yes.  with intent/plan  Homicidal Thoughts:  No  Memory:  Immediate;   Fair Recent;   Fair Remote;   Fair  Judgement:  Impaired  Insight:  Fair  Psychomotor Activity:  Decreased  Concentration:  Concentration: Fair and Attention Span: Fair  Recall:  [employer] of Knowledge:  Fair  Language:  Good  Akathisia:  No  Handed:  Right  AIMS (if indicated):     Assets:  Housing Leisure Time Physical Health Resilience Social Support  ADL's:  Intact  Cognition:  WNL  Sleep:        Treatment Plan Summary: Daily contact with patient to assess and evaluate symptoms and progress in treatment, Medication management and Plan major depressive disorder, recurrent, severe without psychosis:  -Crisis stabilization -Medication management:  Ativan alcohol detox protocol initiated  along with Prozac 10 mg daily for depression -Individual and substance abuse counseling  Disposition: Recommend psychiatric Inpatient admission when  medically cleared.  Levi Boga, NP 07/18/2015 1:05 PM Patient seen face-to-face for psychiatric evaluation, chart reviewed and case discussed with the physician extender and developed treatment plan. Reviewed the information documented and agree with the treatment plan. Corena Pilgrim, MD

## 2015-07-18 NOTE — Progress Notes (Addendum)
Initial Interdisciplinary Treatment Plan   PATIENT STRESSORS: Financial difficulties Loss of home Substance abuse   PATIENT STRENGTHS: Ability for insight Capable of independent living General fund of knowledge Motivation for treatment/growth   PROBLEM LIST: Problem List/Patient Goals Date to be addressed Date deferred Reason deferred Estimated date of resolution  'I have no place to stay"' 07/18/2015     " I wanted to attempt suicide." 07/18/2015     "I drink 12 40 oz. Weekly." 07/18/2015     "I want to find a place to stay." 07/18/2015     "I want to learn management strategies." 07/18/2015                              DISCHARGE CRITERIA:  Ability to meet basic life and health needs Adequate post-discharge living arrangements Improved stabilization in mood, thinking, and/or behavior Medical problems require only outpatient monitoring Motivation to continue treatment in a less acute level of care Reduction of life-threatening or endangering symptoms to within safe limits Safe-care adequate arrangements made Verbal commitment to aftercare and medication compliance Withdrawal symptoms are absent or subacute and managed without 24-hour nursing intervention  PRELIMINARY DISCHARGE PLAN: Attend aftercare/continuing care group Attend 12-step recovery group Outpatient therapy Placement in alternative living arrangements  PATIENT/FAMIILY INVOLVEMENT: This treatment plan has been presented to and reviewed with the patient, Levi Welch.  The patient and family has been given the opportunity to ask questions and make suggestions.  Laren BoomHarmon, Levi Welch P 07/18/2015, 4:31 PM

## 2015-07-18 NOTE — Progress Notes (Signed)
D.  Pt pleasant on approach, complaint of withdrawal and insomnia noted.  Pt was positive for evening AA group, observed playing cards afterward and interacting appropriately with peers on the unit.  Pt denies SI/HI/hallucinations at this time.  A.  Support and encouragement offered, medication given as ordered  R.  Pt remains safe on the unit, will continue to monitor.

## 2015-07-19 ENCOUNTER — Encounter (HOSPITAL_COMMUNITY): Payer: Self-pay | Admitting: Psychiatry

## 2015-07-19 DIAGNOSIS — F332 Major depressive disorder, recurrent severe without psychotic features: Principal | ICD-10-CM

## 2015-07-19 NOTE — [facility] Suicide Risk Assessment (Signed)
[facility] INPATIENT:  Family/Significant Other Suicide Prevention Education  Suicide Prevention Education:  Patient Refusal for Family/Significant Other Suicide Prevention Education: The patient Levi Welch has refused to provide written consent for family/significant other to be provided Family/Significant Other Suicide Prevention Education during admission and/or prior to discharge.  Physician notified. [name] reviewed with patient and brochure provided. Patient encouraged to return to hospital if having suicidal thoughts, patient verbalized his/her understanding and has no further questions at this time.   Levi Welch, Levi Welch 07/19/2015, 11:47 AM
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
## 2015-07-19 NOTE — Tx Team (Signed)
Interdisciplinary Treatment Plan Update (Adult) Date: 07/19/2015    Time Reviewed: 9:30 AM  Progress in Treatment: Attending groups: Continuing to assess, patient new to milieu Participating in groups: Continuing to assess, patient new to milieu Taking medication as prescribed: Yes Tolerating medication: Yes Family/Significant other contact made: No, CSW assessing for appropriate contacts Patient understands diagnosis: Yes Discussing patient identified problems/goals with staff: Yes Medical problems stabilized or resolved: Yes Denies suicidal/homicidal ideation: Yes Issues/concerns per patient self-inventory: Yes Other:  New problem(s) identified: N/A  Discharge Plan or Barriers: Home with outpatient services at [facility].  Reason for Continuation of Hospitalization:  Depression Anxiety Medication Stabilization   Comments: N/A  Estimated length of stay: 3-5 days    Patient is a 45 year old male admitted with suicidal ideations, depression, and alcohol abuse. Pt reports primary trigger(s) for admission include eviction from boarding house due to difficulty paying rent, and financial stressors. Patient plans to return to previous living situation temporarily before locating alternative housing. He plans to follow up with [facility]. Patient will benefit from crisis stabilization, medication evaluation, group therapy and psycho education in addition to case management for discharge planning. At discharge, it is recommended that Pt remain compliant with established discharge plan and continued treatment.   Review of initial/current patient goals per problem list:  1. Goal(s): Patient will participate in aftercare plan   Met: Yes   Target date: 3-5 days post admission date   As evidenced by: Patient will participate within aftercare plan AEB aftercare provider and housing plan at discharge being identified.  7/10: Goal met. Patient plans to return home to follow up with  outpatient services.    2. Goal (s): Patient will exhibit decreased depressive symptoms and suicidal ideations.   Met: No   Target date: 3-5 days post admission date   As evidenced by: Patient will utilize self rating of depression at 3 or below and demonstrate decreased signs of depression or be deemed stable for discharge by MD.  7/10: Goal progressing. Patient admitted with high depression level.   4. Goal(s): Patient will demonstrate decreased signs of withdrawal due to substance abuse   Met: No   Target date: 3-5 days post admission date   As evidenced by: Patient will produce a CIWA/COWS score of 0, have stable vitals signs, and no symptoms of withdrawal  7/10: Goal not met: Pt continues to have withdrawal symptoms of headache, anxiety, and sweating and a CIWA score of a 3.  Pt to show decrease withdrawal symptoms prior to d/c.    Attendees: Patient:    Family:    Physician: Dr. Parke Poisson 07/19/2015 9:30 AM  Nursing: Marcella Dubs, Hedwig Morton, RN 07/19/2015 9:30 AM  Clinical Social Worker: Tilden Fossa, LCSW 07/19/2015 9:30 AM  Other: Peri Maris, LCSWA 07/19/2015 9:30 AM  Other:  07/19/2015 9:30 AM  Other:  07/19/2015 9:30 AM  Other: May Antony Salmon, NP 07/19/2015 9:30 AM  Other:    Other:         Scribe for Treatment Team:  Tilden Fossa, [place]

## 2015-07-19 NOTE — [facility] Group Notes (Signed)
[facility] LCSW Group Therapy  07/19/2015   1:15 PM   Type of Therapy:  Group Therapy  Participation Level:  Minimal  Participation Quality:  Attentive, Limited  Affect: Blunted  Cognitive:  Alert and Oriented  Insight:  Developing/Improving and Engaged  Engagement in Therapy:  Developing/Improving and Engaged  Modes of Intervention:  Clarification, Confrontation, Discussion, Education, Exploration, Limit-setting, Orientation, Problem-solving, Rapport Building, Reality Testing, Socialization and Support  Summary of Progress/Problems: The topic for group therapy was feelings about diagnosis.  Pt actively participated in group discussion on their past and current diagnosis and how they feel towards this.  Pt also identified how society and family members judge them, based on their diagnosis as well as stereotypes and stigmas. Patient participated minimally in discussion despite CSW encouragement.   Raksha Wolfgang, MSW, LCSW Clinical Social Worker [facility] [phone]     

## 2015-07-19 NOTE — H&P (Signed)
Psychiatric Admission Assessment Adult  Patient Identification: Levi Welch MRN:  [id number] Date of Evaluation:  07/19/2015 Chief Complaint:  Patient states "I started drinking again because of stress."  Principal Diagnosis: Major depressive disorder, recurrent severe without psychotic features ([place]) Diagnosis:   Patient Active Problem List   Diagnosis Date Noted  . Alcohol use disorder, severe, dependence ([place]) [F10.20] 07/18/2015  . MDD (major depressive disorder), severe ([place]) [F32.2] 07/18/2015  . Major depressive disorder, recurrent severe without psychotic features ([place]) [F33.2] 07/18/2015  . Neurofibromatosis, type 1 ([place]) [Q85.01]    History of Present Illness:   [name] is an 45 y.o. single male who presented unaccompanied to [facility] voluntarily via Event organiser. Patient was very intoxicated and tearful according to admission assessment.  Patient reports he ingested eight tabs of Valerian root and drank an excessive amount of alcohol in a suicide attempt. Patient reports he filed a complaint against his landlord and is in the process of being evicted. Patient fears he will be homeless.  He states he has a long history of alcohol abuse reporting that he relapsed about six months ago.  Patient reports symptoms including crying spells, social withdrawal, loss of interest in usual pleasures, fatigue, irritability, decreased concentration, decreased sleep, decreased appetite and feelings of guilt and hopelessness. Patient denies any previous suicide attempts. He denies any history of intentional self-injurious behaviors. He denies any history of psychotic symptoms. Patient reports he has been abusing alcohol for years and is unable to maintain sobriety except for a time in the in the past of "two and a half years." He states he drinks approximately three 40-ounce bottles of "[employer]" daily. Patient denies other substance use. Patient reports a history of blackouts and  withdrawal symptoms including tremors, sweats, nausea, vomiting, diarrhea and insomnia. He denies history of seizures. His blood alcohol was 386 on admission and his urine drug screen is negative. Patient is noted to be slightly tremulous during today's assessment. Patient identifies his housing situation as his primary stressor. He says he currently rents a residence with three roommates. 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 He says his mother abused alcohol and died when patient was age 45 of alcohol related causes.  Patient reports he received alcohol detox twice at [facility] treatment center and his most recent treatment was in 2015. Burt reports that his symptoms have improved slightly since admission and that he is not as suicidal as before. He appears motivated to follow up with the [facility] ACTT team following his discharge. His CIWA score this morning was an eight and his blood pressure is elevated at this time, likely related to alcohol withdrawal.   Associated Signs/Symptoms: Depression Symptoms:  depressed mood, fatigue, feelings of worthlessness/guilt, hopelessness, suicidal attempt, anxiety, loss of energy/fatigue, disturbed sleep, (Hypo) Manic Symptoms:  Denies Anxiety Symptoms:  Excessive Worry, Psychotic Symptoms:  Denies PTSD Symptoms: Negative Total Time spent with patient: 45 minutes  Past Psychiatric History: MDD, Alcohol Use Disorder   Is the patient at risk to self? Yes.    Has the patient been a risk to self in the past 6 months? Yes.    Has the patient been a risk to self within the distant past? No.  Is the patient a risk to others? No.  Has the patient been a risk to others in the past 6 months? No.  Has the patient been a risk to others within the distant past? No.  Prior Inpatient Therapy:  No Prior Outpatient Therapy:  Yes  Alcohol Screening: 1. How often do you have a drink containing alcohol?: 2 to 3 times a  week 2. How many drinks containing alcohol do you have on a typical day when you are drinking?: 10 or more 3. How often do you have six or more drinks on one occasion?: Weekly Preliminary Score: 7 4. How often during the last year have you found that you were not able to stop drinking once you had started?: Weekly 5. How often during the last year have you failed to do what was normally expected from you becasue of drinking?: Never 6. How often during the last year have you needed a first drink in the morning to get yourself going after a heavy drinking session?: Weekly 7. How often during the last year have you had a feeling of guilt of remorse after drinking?: Weekly 8. How often during the last year have you been unable to remember what happened the night before because you had been drinking?: Weekly 9. Have you or someone else been injured as a result of your drinking?: No 10. Has a relative or friend or a doctor or another health worker been concerned about your drinking or suggested you cut down?: No Alcohol Use Disorder Identification Test Final Score (AUDIT): 22 Brief Intervention: Yes Substance Abuse History in the last 12 months:  Yes.   Consequences of Substance Abuse: Blackouts:  Reports episodes during times of excessive drinking  Withdrawal Symptoms:   Tremors Previous Psychotropic Medications: Yes  Psychological Evaluations: No  Past Medical History:  Past Medical History  Diagnosis Date  . Neurofibromatosis, type 1 ([place])   . Sleep apnea     Past Surgical History  Procedure Laterality Date  . Carpal tunnel release     Family History: History reviewed. No pertinent family history. Family Psychiatric  History: Denies Tobacco Screening: Denies Social History:  History  Alcohol Use  . Yes    Comment: 12 40 oz of Icehouse beer weekly     History  Drug Use No    Additional Social History: Marital status: Single Does patient have children?: No                          Allergies:  No Known Allergies Lab Results:  Results for orders placed or performed during the hospital encounter of 07/17/15 (from the past 48 hour(s))  Comprehensive metabolic panel     Status: Abnormal   Collection Time: 07/17/15  9:20 PM  Result Value Ref Range   Sodium 137 135 - 145 mmol/L   Potassium 3.5 3.5 - 5.1 mmol/L   Chloride 108 101 - 111 mmol/L   CO2 21 (L) 22 - 32 mmol/L   Glucose, Bld 108 (H) 65 - 99 mg/dL   BUN 8 6 - 20 mg/dL   Creatinine, Ser 0.83 0.61 - 1.24 mg/dL   Calcium 8.5 (L) 8.9 - 10.3 mg/dL   Total Protein 6.5 6.5 - 8.1 g/dL   Albumin 3.9 3.5 - 5.0 g/dL   AST 38 15 - 41 U/L   ALT 42 17 - 63 U/L   Alkaline Phosphatase 61 38 - 126 U/L   Total Bilirubin 0.1 (L) 0.3 - 1.2 mg/dL   GFR calc non Af Amer >60 >60 mL/min   GFR calc Af Amer >60 >60 mL/min    Comment: (NOTE) The eGFR has been calculated using the CKD EPI  equation. This calculation has not been validated in all clinical situations. eGFR's persistently <60 mL/min signify possible Chronic Kidney Disease.    Anion gap 8 5 - 15  Ethanol     Status: Abnormal   Collection Time: 07/17/15  9:20 PM  Result Value Ref Range   Alcohol, Ethyl (B) 386 (HH) <5 mg/dL    Comment:        LOWEST DETECTABLE LIMIT FOR SERUM ALCOHOL IS 5 mg/dL FOR MEDICAL PURPOSES ONLY CRITICAL RESULT CALLED TO, READ BACK BY AND VERIFIED WITH: A DENNIS RN 2202 94/17/40 A NAVARRO   Salicylate level     Status: None   Collection Time: 07/17/15  9:20 PM  Result Value Ref Range   Salicylate Lvl <8.1 2.8 - 30.0 mg/dL  Acetaminophen level     Status: Abnormal   Collection Time: 07/17/15  9:20 PM  Result Value Ref Range   Acetaminophen (Tylenol), Serum <10 (L) 10 - 30 ug/mL    Comment:        THERAPEUTIC CONCENTRATIONS VARY SIGNIFICANTLY. A RANGE OF 10-30 ug/mL MAY BE AN EFFECTIVE CONCENTRATION FOR MANY PATIENTS. HOWEVER, SOME ARE BEST TREATED AT CONCENTRATIONS OUTSIDE THIS RANGE. ACETAMINOPHEN  CONCENTRATIONS >150 ug/mL AT 4 HOURS AFTER INGESTION AND >50 ug/mL AT 12 HOURS AFTER INGESTION ARE OFTEN ASSOCIATED WITH TOXIC REACTIONS.   cbc     Status: Abnormal   Collection Time: 07/17/15  9:20 PM  Result Value Ref Range   WBC 9.8 4.0 - 10.5 K/uL   RBC 5.81 4.22 - 5.81 MIL/uL   Hemoglobin 17.8 (H) 13.0 - 17.0 g/dL   HCT 50.4 39.0 - 52.0 %   MCV 86.7 78.0 - 100.0 fL   MCH 30.6 26.0 - 34.0 pg   MCHC 35.3 30.0 - 36.0 g/dL   RDW 14.2 11.5 - 15.5 %   Platelets 332 150 - 400 K/uL  Rapid urine drug screen (hospital performed)     Status: None   Collection Time: 07/17/15 11:39 PM  Result Value Ref Range   Opiates NONE DETECTED NONE DETECTED   Cocaine NONE DETECTED NONE DETECTED   Benzodiazepines NONE DETECTED NONE DETECTED   Amphetamines NONE DETECTED NONE DETECTED   Tetrahydrocannabinol NONE DETECTED NONE DETECTED   Barbiturates NONE DETECTED NONE DETECTED    Comment:        DRUG SCREEN FOR MEDICAL PURPOSES ONLY.  IF CONFIRMATION IS NEEDED FOR ANY PURPOSE, NOTIFY LAB WITHIN 5 DAYS.        LOWEST DETECTABLE LIMITS FOR URINE DRUG SCREEN Drug Class       Cutoff (ng/mL) Amphetamine      1000 Barbiturate      200 Benzodiazepine   448 Tricyclics       185 Opiates          300 Cocaine          300 THC              50     Blood Alcohol level:  Lab Results  Component Value Date   ETH 386* 63/14/9702    Metabolic Disorder Labs:  No results found for: HGBA1C, MPG No results found for: PROLACTIN No results found for: CHOL, TRIG, HDL, CHOLHDL, VLDL, LDLCALC  Current Medications: Current Facility-Administered Medications  Medication Dose Route Frequency Provider Last Rate Last Dose  . acetaminophen (TYLENOL) tablet 650 mg  650 mg Oral Q6H PRN Patrecia Pour, NP      . alum & mag hydroxide-simeth (MAALOX/MYLANTA) 200-200-20 MG/5ML suspension  30 mL  30 mL Oral Q4H PRN Patrecia Pour, NP      . FLUoxetine (PROZAC) capsule 10 mg  10 mg Oral Daily Patrecia Pour, NP   10 mg  at 07/19/15 2458  . hydrOXYzine (ATARAX/VISTARIL) tablet 25 mg  25 mg Oral Q6H PRN Encarnacion Slates, NP   25 mg at 07/19/15 0520  . loperamide (IMODIUM) capsule 2-4 mg  2-4 mg Oral PRN Encarnacion Slates, NP      . LORazepam (ATIVAN) tablet 1 mg  1 mg Oral Q6H PRN Encarnacion Slates, NP   1 mg at 07/19/15 0520  . LORazepam (ATIVAN) tablet 1 mg  1 mg Oral QID Encarnacion Slates, NP   1 mg at 07/19/15 0998   Followed by  . [START ON 07/20/2015] LORazepam (ATIVAN) tablet 1 mg  1 mg Oral TID Encarnacion Slates, NP       Followed by  . [START ON 07/21/2015] LORazepam (ATIVAN) tablet 1 mg  1 mg Oral BID Encarnacion Slates, NP       Followed by  . [START ON 07/22/2015] LORazepam (ATIVAN) tablet 1 mg  1 mg Oral Daily Encarnacion Slates, NP      . magnesium hydroxide (MILK OF MAGNESIA) suspension 30 mL  30 mL Oral Daily PRN Patrecia Pour, NP      . multivitamin with minerals tablet 1 tablet  1 tablet Oral Daily Encarnacion Slates, NP   1 tablet at 07/19/15 [phone]  . ondansetron (ZOFRAN-ODT) disintegrating tablet 4 mg  4 mg Oral Q6H PRN Encarnacion Slates, NP      . thiamine (VITAMIN B-1) tablet 100 mg  100 mg Oral Daily Encarnacion Slates, NP   100 mg at 07/19/15 0808  . traZODone (DESYREL) tablet 50 mg  50 mg Oral QHS PRN Nanci Pina, FNP       PTA Medications: No prescriptions prior to admission    Musculoskeletal: Strength & Muscle Tone: within normal limits Gait & Station: normal Patient leans: N/A  Psychiatric Specialty Exam: Physical Exam  Constitutional:  Reviewed physical exam findings from [place] from 07/17/2015.     Review of Systems  Neurological: Positive for tremors.  Psychiatric/Behavioral: Positive for depression, suicidal ideas and substance abuse. Negative for hallucinations and memory loss. The patient is nervous/anxious and has insomnia.     Blood pressure 154/95, pulse 90, temperature 98 F (36.7 C), temperature source Oral, resp. rate 18, height _0  (1.651 m), weight 64.864 kg (143 lb), SpO2 98 %.Body mass index is  23.8 kg/(m^2).  General Appearance: Casual  Eye Contact:  Good  Speech:  Clear and Coherent Noted to stutter during assessment  Volume:  Normal  Mood:  Anxious and Depressed  Affect:  Congruent  Thought Process:  Coherent and Goal Directed  Orientation:  Full (Time, Place, and Person)  Thought Content:  Symptoms, worries, concerns  Suicidal Thoughts:  No Denies during assessment but recently overdosed   Homicidal Thoughts:  No  Memory:  Immediate;   Good Recent;   Good Remote;   Good  Judgement:  Fair  Insight:  Present  Psychomotor Activity:  Normal  Concentration:  Concentration: Good and Attention Span: Good  Recall:  Good  Fund of Knowledge:  Good  Language:  Good  Akathisia:  No  Handed:  Right  AIMS (if indicated):     Assets:  Communication Skills Desire for Improvement Leisure Time Physical Health Resilience Talents/Skills  ADL's:  Intact  Cognition:  WNL  Sleep:  Number of Hours: 3.75       Treatment Plan Summary: Daily contact with patient to assess and evaluate symptoms and progress in treatment and Medication management  Observation Level/Precautions:  15 minute checks  Laboratory:  CBC Chemistry Profile UDS Alcohol level   Psychotherapy:  Individual and group   Medications:  Ativan taper for alcohol detox, Continue Prozac 10 mg daily for depressive symptoms  Consultations: As needed  Discharge Concerns:  Continued alcohol abuse, Stable housing  Estimated LOS: 2-5 days  Other:  Outpatient follow up per treatment team at [facility]    I certify that inpatient services furnished can reasonably be expected to improve the patient's condition.    Elmarie Shiley, NP 7/10/20173:14 PM  I have discussed case with NP and have met with patient  Agree with NP note and assessment  45 year old single male, was living in a boarding house. Reports worsening depression, vague suicidal ideations, recent relapse on alcohol, in the context of having recently been  evicted, which he feels was retaliatory measure from his landlord, due to recent complaints he had made Reports heavy , daily drinking x 5 -6 days, prior to admission , but had been drinking " on and off " prior to this.  Admission BAL 386.  Reports history of depression, but denies prior suicide attempts or psychiatric admissions  Dx- Major depression, Alcohol Dependence Plan -inpatient admission , Prozac 10 mgrs QDAY initially, on Ativan detox to minimize risk of withdrawal .

## 2015-07-19 NOTE — Progress Notes (Signed)
Adult Psychoeducational Group Note  Date:  07/19/2015 Time:  10:10 PM  Group Topic/Focus:  Wrap-Up Group:   The focus of this group is to help patients review their daily goal of treatment and discuss progress on daily workbooks.  Participation Level:  Active  Participation Quality:  Appropriate  Affect:  Appropriate  Cognitive:  Alert  Insight: Appropriate  Engagement in Group:  Engaged  Modes of Intervention:  Discussion  Additional Comments:  Patient states, "I did not have a good day at all, due to not being able to sleep for two days". Patient goal for today was "to get medication for sleep".   Zelia Yzaguirre L Cynethia Schindler 07/19/2015, 10:10 PM

## 2015-07-19 NOTE — Progress Notes (Signed)
Recreation Therapy Notes   Date: 07.10.2017 Time: 9:30am  Location: [address] Group Room   Group Topic: Stress Management  Goal Area(s) Addresses:  Patient will actively participate in stress management techniques presented during session.   Behavioral Response: Did not attend.   Mayari Matus L Keani Gotcher, LRT/CTRS        Brayden Betters L 07/19/2015 10:19 AM 

## 2015-07-19 NOTE — [facility] Suicide Risk Assessment (Addendum)
[facility]   Nursing information obtained from:   patient and chart  Demographic factors:   45 year old single male, no children, was living in a boarding house Current Mental Status:   see below  Loss Factors:   recently evicted  Historical Factors:   history of depression, history of alcohol abuse  Risk Reduction Factors:   resilience   Total Time spent with patient: 45 minutes Principal Problem: Major depressive disorder, recurrent severe without psychotic features (H[facility]) Diagnosis:   Patient Active Problem List   Diagnosis Date Noted  . Alcohol use disorder, severe, dependence (H[facility]) [F10.20] 07/18/2015  . MDD (major depressive disorder), severe ([facility]) [F32.2] 07/18/2015  . Major depressive disorder, recurrent severe without psychotic features ([facility]) [F33.2] 07/18/2015  . Neurofibromatosis, type 1 ([facility]) [Q85.01]      Continued Clinical Symptoms:  Alcohol Use Disorder Identification Test Final Score (AUDIT): 22 The "Alcohol Use Disorders Identification Test", Guidelines for Use in Primary Care, Second Edition.  World Science writerHealth Organization [facility](WHO). Score between 0-7:  no or low risk or alcohol related problems. Score between 8-15:  moderate risk of alcohol related problems. Score between 16-19:  high risk of alcohol related problems. Score 20 or above:  warrants further diagnostic evaluation for alcohol dependence and treatment.   CLINICAL FACTORS:  45 year old single male, was living in a boarding house. Reports worsening depression, vague suicidal ideations,  recent relapse on alcohol, in the context of having recently been evicted, which he feels was retaliatory measure from his landlord, due to recent complaints he had made Reports heavy , daily drinking x 5 -6 days, prior to admission , but had been drinking " on and off " prior to this.  Admission BAL 386.  Reports history of depression, but denies prior suicide attempts or psychiatric admissions  Dx-  Major depression, Alcohol Dependence Plan -inpatient admission , Prozac 10 mgrs QDAY initially, on Ativan detox to minimize risk of withdrawal .    Musculoskeletal: Strength & Muscle Tone: within normal limits- mild distal tremors  Gait & Station: normal Patient leans: N/A  Psychiatric Specialty Exam: Physical Exam  ROS states he has visual disturbance related to neurofibromatosis, denies chest pain, no shortness of breath   Blood pressure 154/95, pulse 90, temperature 98 F (36.7 C), temperature source Oral, resp. rate 18, height 5\' 5"  (1.651 m), weight 143 lb (64.864 kg), SpO2 98 %.Body mass index is 23.8 kg/(m^2).  General Appearance: Fairly Groomed  Eye Contact:  Good  Speech:  Normal Rate  Volume:  Decreased  Mood:  Depressed  Affect:  constricted, vaguely anxious   Thought Process:  Linear  Orientation:  Other:  fully alert and attentive   Thought Content:  denies hallucinations, no delusions at this time   Suicidal Thoughts:  No currently denies suicidal ideations, denies plan or intention of hurting self   Homicidal Thoughts:  No  Memory:  recent and remote grossly intact   Judgement:  Fair  Insight:  Fair  Psychomotor Activity:  Normal- no psychomotor restlessness, some distal tremors, no diaphoresis or agitation   Concentration:  Concentration: Good and Attention Span: Good  Recall:  Good  Fund of Knowledge:  Good  Language:  Good  Akathisia:  Negative  Handed:  Right  AIMS (if indicated):     Assets:  Communication Skills Desire for Improvement Resilience  ADL's:  Intact  Cognition:  WNL  Sleep:  Number of Hours: 3.75  COGNITIVE FEATURES THAT CONTRIBUTE TO RISK:  Closed-mindedness and Loss of executive function    SUICIDE RISK:   Moderate:  Frequent suicidal ideation with limited intensity, and duration, some specificity in terms of plans, no associated intent, good self-control, limited dysphoria/symptomatology, some risk factors present, and  identifiable protective factors, including available and accessible social support.  PLAN OF CARE: Patient will be admitted to inpatient psychiatric unit for stabilization and safety. Will provide and encourage milieu participation. Provide medication management and maked adjustments as needed.  Will also provide medication to minimize risk of alcohol WDL .Will follow daily.    I certify that inpatient services furnished can reasonably be expected to improve the patient's condition.   Nehemiah Massed, MD 07/19/2015, 6:39 PM

## 2015-07-20 MED ORDER — TRAZODONE HCL 100 MG PO TABS
100.0000 mg | ORAL_TABLET | Freq: Every evening | ORAL | Status: DC | PRN
  Administered 2015-07-21 – 2015-07-25 (×11): 100 mg via ORAL
  Filled 2015-07-20 (×3): qty 1
  Filled 2015-07-20: qty 14
  Filled 2015-07-20: qty 1
  Filled 2015-07-20: qty 14
  Filled 2015-07-20 (×9): qty 1

## 2015-07-20 MED ORDER — TRAZODONE HCL 100 MG PO TABS
100.0000 mg | ORAL_TABLET | Freq: Every day | ORAL | Status: DC
  Administered 2015-07-20: 100 mg via ORAL
  Filled 2015-07-20 (×2): qty 1

## 2015-07-20 NOTE — Progress Notes (Addendum)
DAR NOTE: Pt present with flat affect and depressed mood in the unit. Pt has been present in the milieu interacting with peers.  Pt denies physical pain, took all his meds as scheduled. As per self inventory, pt had a poor night sleep, good appetite, low energy, and poor concentration. Pt rate depression at 8, hopeless ness at 8 , and anxiety at 8. Pt's safety ensured with 15 minute and environmental checks. Pt currently denies SI/HI and A/V hallucinations. Pt verbally agrees to seek staff if SI/HI or A/VH occurs and to consult with staff before acting on these thoughts. Will continue POC.

## 2015-07-20 NOTE — [facility] Group Notes (Signed)
[facility] LCSW Group Therapy  07/20/2015 1:22 PM  Type of Therapy:  Group Therapy  Participation Level:  Active  Participation Quality:  Attentive  Affect:  Appropriate  Cognitive:  Alert and Oriented  Insight:  Improving  Engagement in Therapy:  Improving  Modes of Intervention:  Discussion, Education, Exploration, Problem-solving, Rapport Building, Socialization and Support  Summary of Progress/Problems: MHA Speaker came to talk about his personal journey with substance abuse and addiction. The pt processed ways by which to relate to the speaker. MHA speaker provided handouts and educational information pertaining to groups and services offered by [facility].   Levi Welch, Levi Proano LCSW 07/20/2015, 1:22 PM

## 2015-07-20 NOTE — Progress Notes (Signed)
[facility] MD Progress Note  07/20/2015 5:59 PM Levi Welch  MRN:  [id number] Subjective:    Patient states "I did not sleep a wink last night. I'm not having much withdrawal symptoms. I'm trying to find a new boarding house in [place] but I will not have the deposit until Friday when I get paid."  Objective:   Patient is seen and chart is reviewed. Patient appears depressed during assessment. He reports very poor sleep last night and now feeling tired. Patient has been present in the milieu and interacting with peers. Patient is a 45 year old single male, who was living in a boarding house prior to admission. Reports worsening depression, vague suicidal ideations, recent relapse on alcohol, in the context of having recently been evicted, which he feels was retaliatory measure from his landlord, due to recent complaints he had made. Reports heavy, daily drinking x 5 -6 days, prior to admission , but had been drinking " on and off " prior to this. His admission BAL was 386. Reports history of depression, but denies prior suicide attempts or psychiatric admissions. During assessment today patient was noted to have a list of boarding houses in [place] and was actively making phone calls to secure a new housing situation.   Principal Problem: Major depressive disorder, recurrent severe without psychotic features (HCC) Diagnosis:   Patient Active Problem List   Diagnosis Date Noted  . Alcohol use disorder, severe, dependence (HCC) [F10.20] 07/18/2015  . MDD (major depressive disorder), severe (HCC) [F32.2] 07/18/2015  . Major depressive disorder, recurrent severe without psychotic features (HCC) [F33.2] 07/18/2015  . 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 Sleep apnea     Past Surgical History  Procedure  Laterality Date  . Carpal tunnel release     Family History: History reviewed. No pertinent family history. Family Psychiatric  History: See H & P Social History:  History  Alcohol Use  . Yes    Comment: 12 40 oz of Icehouse beer weekly     History  Drug Use No    Social History   Social History  . Marital Status: Single    Spouse Name: N/A  . Number of Children: N/A  . Years of Education: N/A   Social History Main Topics  . Smoking status: Never Smoker   . Smokeless tobacco: None  . Alcohol Use: Yes     Comment: 12 40 oz of Icehouse beer weekly  . Drug Use: No  . Sexual Activity: Not Asked   Other Topics Concern  . None   Social History Narrative   Additional Social History:                         Sleep: Poor  Appetite:  Good  Current Medications: Current Facility-Administered Medications  Medication Dose Route Frequency Provider Last Rate Last Dose  . acetaminophen (TYLENOL) tablet 650 mg  650 mg Oral Q6H PRN Charm Rings, NP      . alum & mag hydroxide-simeth (MAALOX/MYLANTA) 200-200-20 MG/5ML suspension 30 mL  30 mL Oral Q4H PRN Charm Rings, NP      . FLUoxetine (PROZAC) capsule 10 mg  10 mg Oral Daily Charm Rings, NP   10 mg at 07/20/15 0756  .  hydrOXYzine (ATARAX/VISTARIL) tablet 25 mg  25 mg Oral Q6H PRN Sanjuana Kava, NP   25 mg at 07/19/15 0520  . loperamide (IMODIUM) capsule 2-4 mg  2-4 mg Oral PRN Sanjuana Kava, NP      . LORazepam (ATIVAN) tablet 1 mg  1 mg Oral Q6H PRN Sanjuana Kava, NP   1 mg at 07/19/15 0520  . [START ON 07/21/2015] LORazepam (ATIVAN) tablet 1 mg  1 mg Oral BID Sanjuana Kava, NP       Followed by  . [START ON 07/22/2015] LORazepam (ATIVAN) tablet 1 mg  1 mg Oral Daily Sanjuana Kava, NP      . magnesium hydroxide (MILK OF MAGNESIA) suspension 30 mL  30 mL Oral Daily PRN Charm Rings, NP      . multivitamin with minerals tablet 1 tablet  1 tablet Oral Daily Sanjuana Kava, NP   1 tablet at 07/20/15 0756  .  ondansetron (ZOFRAN-ODT) disintegrating tablet 4 mg  4 mg Oral Q6H PRN Sanjuana Kava, NP      . thiamine (VITAMIN B-1) tablet 100 mg  100 mg Oral Daily Sanjuana Kava, NP   100 mg at 07/20/15 0757  . traZODone (DESYREL) tablet 100 mg  100 mg Oral QHS Thermon Leyland, NP        Lab Results: No results found for this or any previous visit (from the past 48 hour(s)).  Blood Alcohol level:  Lab Results  Component Value Date   ETH 386* 07/17/2015    Metabolic Disorder Labs: No results found for: HGBA1C, MPG No results found for: PROLACTIN No results found for: CHOL, TRIG, HDL, CHOLHDL, VLDL, LDLCALC  Physical Findings: AIMS: Facial and Oral Movements Muscles of Facial Expression: None, normal Lips and Perioral Area: None, normal Jaw: None, normal Tongue: None, normal,Extremity Movements Upper (arms, wrists, hands, fingers): None, normal Lower (legs, knees, ankles, toes): None, normal, Trunk Movements Neck, shoulders, hips: None, normal, Overall Severity Severity of abnormal movements (highest score from questions above): None, normal Incapacitation due to abnormal movements: None, normal Patient's awareness of abnormal movements (rate only patient's report): No Awareness, Dental Status Current problems with teeth and/or dentures?: No Does patient usually wear dentures?: No  CIWA:  CIWA-Ar Total: 2 COWS:  COWS Total Score: 0  Musculoskeletal: Strength & Muscle Tone: within normal limits Gait & Station: normal Patient leans: N/A  Psychiatric Specialty Exam: Physical Exam  Review of Systems  Neurological: Positive for tremors (Mild ).  Psychiatric/Behavioral: Positive for depression and substance abuse. Negative for suicidal ideas, hallucinations and memory loss. The patient is nervous/anxious and has insomnia.     Blood pressure 145/103, pulse 86, temperature 98.1 F (36.7 C), temperature source Oral, resp. rate 18, height 5\' 5"  (1.651 m), weight 64.864 kg (143 lb), SpO2 98  %.Body mass index is 23.8 kg/(m^2).  General Appearance: Casual  Eye Contact:  Good  Speech:  Normal Rate  Volume:  Normal  Mood:  Depressed  Affect:  Constricted  Thought Process:  Linear  Orientation:  Full (Time, Place, and Person)  Thought Content:  Concerns about finances, housing  Suicidal Thoughts:  No  Homicidal Thoughts:  No  Memory:  Immediate;   Good Recent;   Good Remote;   Good  Judgement:  Fair  Insight:  Fair  Psychomotor Activity:  Normal  Concentration:  Concentration: Good and Attention Span: Good  Recall:  Good  Fund of Knowledge:  Good  Language:  Good  Akathisia:  Negative  Handed:  Right  AIMS (if indicated):     Assets:  Communication Skills Desire for Improvement Leisure Time Resilience Vocational/Educational  ADL's:  Intact  Cognition:  WNL  Sleep:  Number of Hours: 2.5   Treatment Plan Summary: Daily contact with patient to assess and evaluate symptoms and progress in treatment and Medication management   -Continue Prozac 10 mg daily for symptoms of depression/anxiety -Increase Trazodone to 100 mg hs for insomnia.  -Continue ativan taper due to report from patient of daily alcohol intake over the last six months.   Fransisca KaufmannAVIS, LAURA, NP 07/20/2015, 5:59 PM Agree with NP Progress Note, as above

## 2015-07-20 NOTE — Progress Notes (Signed)
Pt has been observed in the milieu, mostly in the dayroom watching TV with minimal interaction with other patients.  Pt states he has not been able to rest d/t his withdrawal symptoms.  He is being medicated per the detox protocol and has been encouraged to report his symptoms so that he can be given prn medications.  Pt denies SI/HI/AVH.  He has been pleasant and cooperative with Public librarianwriter and staff tonight.  Pt would like long term treatment after his detox.  Support and encouragement offered.  Pt makes his needs known to staff.  Discharge plans are in process.  Safety maintained with q15 minute checks.

## 2015-07-20 NOTE — Progress Notes (Signed)
Patient attended AA group tonight. 

## 2015-07-21 MED ORDER — ACAMPROSATE CALCIUM 333 MG PO TBEC
666.0000 mg | DELAYED_RELEASE_TABLET | Freq: Three times a day (TID) | ORAL | Status: DC
  Administered 2015-07-22 – 2015-07-26 (×14): 666 mg via ORAL
  Filled 2015-07-21 (×20): qty 2

## 2015-07-21 NOTE — Progress Notes (Signed)
Pt reports he is frustrated that he hasn't been able to sleep for the last couple of nights.  He states that he is not having any withdrawal symptoms, but reports his anxiety is high d/t his insomnia.  His trazodone was increased for tonight, so he is hopeful that he will be able to sleep.  Pt has been in the dayroom watching TV.  He does not talk much with peers as he stutters, but he does interact appropriately with others.  Pt is pleasant and cooperative.  He makes his needs known to staff.  Support and encouragement offered.  Discharge plans are in process.  Safety maintained with q15 minute checks.

## 2015-07-21 NOTE — Progress Notes (Signed)
DAR: Pt has been in the bed most of the day, pt stated he did not sleep well last night even after taking trazodone 200 mg, pt stated he started feeling sleepy after break fast  Pt stated his depression is 3, hopelessness is 0, and anxiety is 3. Pt's safety ensured with 15 minute and environmental checks. Pt currently denies SI/HI and A/V hallucinations. Pt verbally agrees to seek staff if SI/HI or A/VH occurs and to consult with staff before acting on any harmful thoughts. Will continue POC.

## 2015-07-21 NOTE — Progress Notes (Addendum)
Patient ID: Levi Welch, male   DOB: August 02, 1970, 45 y.o.   MRN: [id number] [facility] MD Progress Note  07/21/2015 5:04 PM Heaton Sarin  MRN:  [id number] Subjective:    Patient states his sleep has been poor and focused on recent insomnia . However, he states that he did sleep this AM " finally I got some sleep", and states that he is therefore feeling more rested and generally better this afternoon. At this time does not endorse severe or ongoing symptoms of alcohol WDL. He does express interest in Campral trial to address alcohol cravings and to help him in his efforts to maintain sobriety. At this time he is future oriented, is thinking of going to a boarding house after discharge.  Objective:   Patient is seen and chart is reviewed.  Patient still somewhat anxious and depressed, but states he is feeling better , and as noted, hopeful that his insomnia, which has been major issue for him recently, is improving . No tremors, no diaphoresis, no acute distress or restlessness, vitals are stable. No disruptive or agitated behaviors on unit, going to some groups, visible in day room    Principal Problem: Major depressive disorder, recurrent severe without psychotic features (HCC) Diagnosis:   Patient Active Problem List   Diagnosis Date Noted  . Alcohol use disorder, severe, dependence (HCC) [F10.20] 07/18/2015  . MDD (major depressive disorder), severe (HCC) [F32.2] 07/18/2015  . Major depressive disorder, recurrent severe without psychotic features (HCC) [F33.2] 07/18/2015  . 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 Sleep apnea     Past Surgical History  Procedure Laterality Date  . Carpal tunnel release     Family History: History reviewed. No pertinent family history. Family Psychiatric   History: See H & P Social History:  History  Alcohol Use  . Yes    Comment: 12 40 oz of Icehouse beer weekly     History  Drug Use No    Social History   Social History  . Marital Status: Single    Spouse Name: N/A  . Number of Children: N/A  . Years of Education: N/A   Social History Main Topics  . Smoking status: Never Smoker   . Smokeless tobacco: None  . Alcohol Use: Yes     Comment: 12 40 oz of Icehouse beer weekly  . Drug Use: No  . Sexual Activity: Not Asked   Other Topics Concern  . None   Social History Narrative   Additional Social History:   Sleep: Poor, but improving   Appetite:  Good  Current Medications: Current Facility-Administered Medications  Medication Dose Route Frequency Provider Last Rate Last Dose  . acetaminophen (TYLENOL) tablet 650 mg  650 mg Oral Q6H PRN Charm Rings, NP      . alum & mag hydroxide-simeth (MAALOX/MYLANTA) 200-200-20 MG/5ML suspension 30 mL  30 mL Oral Q4H PRN Charm Rings, NP      . FLUoxetine (PROZAC) capsule 10 mg  10 mg Oral Daily Charm Rings, NP   10 mg at 07/21/15 7829  . [START ON 07/22/2015] LORazepam (ATIVAN) tablet 1 mg  1 mg Oral Daily Sanjuana Kava, NP      . magnesium hydroxide (MILK OF MAGNESIA) suspension 30 mL  30 mL Oral  Daily PRN Charm Rings, NP      . multivitamin with minerals tablet 1 tablet  1 tablet Oral Daily Sanjuana Kava, NP   1 tablet at 07/21/15 [phone]  . thiamine (VITAMIN B-1) tablet 100 mg  100 mg Oral Daily Sanjuana Kava, NP   100 mg at 07/21/15 9604  . traZODone (DESYREL) tablet 100 mg  100 mg Oral QHS,MR X 1 Kerry Hough, PA-C   100 mg at 07/21/15 0139    Lab Results: No results found for this or any previous visit (from the past 48 hour(s)).  Blood Alcohol level:  Lab Results  Component Value Date   ETH 386* 07/17/2015    Metabolic Disorder Labs: No results found for: HGBA1C, MPG No results found for: PROLACTIN No results found for: CHOL, TRIG, HDL, CHOLHDL, VLDL,  LDLCALC  Physical Findings: AIMS: Facial and Oral Movements Muscles of Facial Expression: None, normal Lips and Perioral Area: None, normal Jaw: None, normal Tongue: None, normal,Extremity Movements Upper (arms, wrists, hands, fingers): None, normal Lower (legs, knees, ankles, toes): None, normal, Trunk Movements Neck, shoulders, hips: None, normal, Overall Severity Severity of abnormal movements (highest score from questions above): None, normal Incapacitation due to abnormal movements: None, normal Patient's awareness of abnormal movements (rate only patient's report): No Awareness, Dental Status Current problems with teeth and/or dentures?: No Does patient usually wear dentures?: No  CIWA:  CIWA-Ar Total: 3 COWS:  COWS Total Score: 0  Musculoskeletal: Strength & Muscle Tone: within normal limits Gait & Station: normal Patient leans: N/A  Psychiatric Specialty Exam: Physical Exam  Review of Systems  Neurological: Positive for tremors (Mild ).  Psychiatric/Behavioral: Positive for depression and substance abuse. Negative for suicidal ideas, hallucinations and memory loss. The patient is nervous/anxious and has insomnia.     Blood pressure 126/86, pulse 88, temperature 97.9 F (36.6 C), temperature source Oral, resp. rate 18, height 5\' 5"  (1.651 m), weight 143 lb (64.864 kg), SpO2 98 %.Body mass index is 23.8 kg/(m^2).  General Appearance: Casual  Eye Contact:  Good  Speech:  Normal Rate  Volume:  Normal  Mood:  Less depressed   Affect:  Less constricted, still anxious   Thought Process:  Linear  Orientation:  Full (Time, Place, and Person)  Thought Content:  Concerns about finances, housing- no hallucinations , no delusions   Suicidal Thoughts:  No denies suicidal ideations or self injurious ideations at this time   Homicidal Thoughts:  No  Memory:  Immediate;   Good Recent;   Good Remote;   Good  Judgement:  Improving   Insight:  Improving   Psychomotor Activity:   Normal  Concentration:  Concentration: Good and Attention Span: Good  Recall:  Good  Fund of Knowledge:  Good  Language:  Good  Akathisia:  Negative  Handed:  Right  AIMS (if indicated):     Assets:  Communication Skills Desire for Improvement Leisure Time Resilience Vocational/Educational  ADL's:  Intact  Cognition:  WNL  Sleep:  Number of Hours: 3.75   Assessment - patient presenting with partial improvement - less depressed, less anxious, and currently more future oriented. Currently no severe /protracted symptoms of alcohol withdrawal noted or reported. Tolerating Prozac well thus far. Interested in Campral trial to address alcohol cravings- side effects reviewed  Treatment Plan Summary: Daily contact with patient to assess and evaluate symptoms and progress in treatment and Medication management  Encourage ongoing group /milieu participation to work on Pharmacologist and  symptom reduction  Continue to encourage efforts to maintain sobriety, relapse prevention efforts . Increase  Prozac to 20  mg daily for symptoms of depression/anxiety Continue Trazodone at 100 mg hs for insomnia.  Completing Ativan detox/taper due to report from patient of daily alcohol intake over the last six months.  Start Campral 666 mgrs TID for alcohol cravings Treatment team working on disposition planning  Nehemiah MassedOBOS, Brodi Nery, MD 07/21/2015, 5:04 PM

## 2015-07-21 NOTE — Progress Notes (Signed)
Recreation Therapy Notes   Date: 07.12.2017 Time: 9:30am Location: [address] Group Room   Group Topic: Stress Management  Goal Area(s) Addresses:  Patient will actively participate in stress management techniques presented during session.   Behavioral Response: Did not attend.   Wilho Sharpley L Audrick Lamoureaux, LRT/CTRS        Karina Lenderman L 07/21/2015 11:43 AM 

## 2015-07-21 NOTE — Progress Notes (Signed)
Adult Psychoeducational Group Note  Date:  07/21/2015 Time:  9:41 PM  Group Topic/Focus:  Wrap-Up Group:   The focus of this group is to help patients review their daily goal of treatment and discuss progress on daily workbooks.  Participation Level:  Active  Participation Quality:  Appropriate, Sharing and Supportive  Affect:  Appropriate  Cognitive:  Alert, Appropriate and Oriented  Insight: Appropriate  Engagement in Group:  Engaged  Modes of Intervention:  Discussion and Support  Additional Comments:  Patient attended group and rated his day to be a 10.  He expressed that he slept all day and that made his feel much better.   Keonda Dow W Harlyn Italiano 07/21/2015, 9:41 PM

## 2015-07-22 NOTE — Plan of Care (Signed)
Problem: Safety: Goal: Ability to remain free from injury will improve Outcome: Progressing Pt denies self harm thoughts.  Problem: Medication: Goal: Compliance with prescribed medication regimen will improve Outcome: Progressing Pt taking medications as prescribed.

## 2015-07-22 NOTE — Progress Notes (Signed)
Patient ID: Levi Welch, male   DOB: 03-Dec-1970, 45 y.o.   MRN: [id number] Adult Psychoeducational Group Note  Date:  07/22/2015 Time: 09:00am   Group Topic/Focus:  Rediscovering Joy:   The focus of this group is to explore various ways to relieve stress in a positive manner.  Participation Level:  Active  Participation Quality:  Appropriate  Affect:  Flat  Cognitive:  Alert and Oriented  Insight: Improving  Engagement in Group:  Engaged  Modes of Intervention:  Discussion, Education and Support  Additional Comments:  Pt identifies being around family as a way to rediscover joy in his life.   Aurora Maskwyman, Azarya Oconnell E 07/22/2015, 10:20 AM

## 2015-07-22 NOTE — Progress Notes (Signed)
Pt has been observed in the dayroom most of the evening watching TV with some interaction with peers.  He denies SI/HI/AVH.  He denies any withdrawal symptoms.  He reports that he was able to get some sleep today, but he has tried to stay up during the afternoon so that he could sleep tonight.  Pt's meds were reviewed with him and pt voices understanding of what his meds will be for tonight.  Pt wants to go to a boarding house when he is discharged.  Support and encouragement offered.  Discharge plans are in process.  Safety maintained with q15 minute checks.

## 2015-07-22 NOTE — [facility] Group Notes (Signed)
[facility] LCSW Group Therapy  07/22/2015 2:30 PM  Type of Therapy:  Group Therapy  Participation Level:  Did Not Attend-pt invited. Chose to rest in bed.   Summary of Progress/Problems: Today's Topic: Overcoming Obstacles. Patients identified one short term goal and potential obstacles in reaching this goal. Patients processed barriers involved in overcoming these obstacles. Patients identified steps necessary for overcoming these obstacles and explored motivation (internal and external) for facing these difficulties head on.   Smart, Demara Lover LCSW 07/22/2015, 2:30 PM

## 2015-07-22 NOTE — Progress Notes (Signed)
D:Pt reports that he slept well last night and is feeling much better this morning. Pt denies withdrawal symptoms. Pt observed with a mild tremor and mildly anxious. He is pleasant and cooperative on the unit. A:Offered support, encouragement and 15 minute checks. R:Pt denies si and hi. Safety maintained on the unit.

## 2015-07-22 NOTE — Progress Notes (Signed)
Patient ID: Levi Welch, male   DOB: [birth year], 45 y.o.   MRN: [id number] Patient ID: Levi Welch, male   DOB: December 08, 1970, 45 y.o.   MRN: [id number] [facility] MD Progress Note  07/22/2015 5:03 PM Tell Rozelle  MRN:  [id number]  Subjective:  Patient states, "The suicidal thoughts are gradually improving. I still have the anxiety, comes & goes. I think being here & not drinking helped a lot".  At this time [place] does not endorse severe or ongoing symptoms of alcohol WDL. He does express interest in Campral trial to address alcohol cravings and to help him in his efforts to maintain sobriety & it was started. At this time he is future oriented, is thinking of going to a boarding house after discharge.  Objective:  Patient is seen and chart is reviewed.  Patient still somewhat anxious and depressed, but states both are improving , and as noted, hopeful that his insomnia, which has been major issue for him recently, is improving . No tremors, no diaphoresis, no acute distress or restlessness, vitals are stable. No disruptive or agitated behaviors on unit, going to some groups, visible in day room.   Principal Problem: Major depressive disorder, recurrent severe without psychotic features (HCC) Diagnosis:   Patient Active Problem List   Diagnosis Date Noted  . Alcohol use disorder, severe, dependence (HCC) [F10.20] 07/18/2015  . MDD (major depressive disorder), severe (HCC) [F32.2] 07/18/2015  . Major depressive disorder, recurrent severe without psychotic features (HCC) [F33.2] 07/18/2015  . 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 Sleep apnea     Past Surgical History  Procedure Laterality Date  . Carpal tunnel release     Family History: History reviewed. No pertinent family history. Family  Psychiatric  History: See H & P Social History:  History  Alcohol Use  . Yes    Comment: 12 40 oz of Icehouse beer weekly     History  Drug Use No    Social History   Social History  . Marital Status: Single    Spouse Name: N/A  . Number of Children: N/A  . Years of Education: N/A   Social History Main Topics  . Smoking status: Never Smoker   . Smokeless tobacco: None  . Alcohol Use: Yes     Comment: 12 40 oz of Icehouse beer weekly  . Drug Use: No  . Sexual Activity: Not Asked   Other Topics Concern  . None   Social History Narrative   Additional Social History:   Sleep: Poor, but improving   Appetite:  Good  Current Medications: Current Facility-Administered Medications  Medication Dose Route Frequency Provider Last Rate Last Dose  . acamprosate (CAMPRAL) tablet 666 mg  666 mg Oral TID WC Craige Cotta, MD   666 mg at 07/22/15 1641  . acetaminophen (TYLENOL) tablet 650 mg  650 mg Oral Q6H PRN Charm Rings, NP      . alum & mag hydroxide-simeth (MAALOX/MYLANTA) 200-200-20 MG/5ML suspension 30 mL  30 mL Oral Q4H PRN Charm Rings, NP      . FLUoxetine (PROZAC) capsule 10 mg  10 mg Oral Daily Charm Rings, NP   10 mg at 07/22/15 0751  . magnesium hydroxide (MILK OF MAGNESIA) suspension 30 mL  30  mL Oral Daily PRN Charm RingsJamison Y Lord, NP      . multivitamin with minerals tablet 1 tablet  1 tablet Oral Daily Sanjuana KavaAgnes I Nwoko, NP   1 tablet at 07/22/15 0752  . thiamine (VITAMIN B-1) tablet 100 mg  100 mg Oral Daily Sanjuana KavaAgnes I Nwoko, NP   100 mg at 07/22/15 0752  . traZODone (DESYREL) tablet 100 mg  100 mg Oral QHS,MR X 1 Craige CottaFernando A Hobie Kohles, MD   100 mg at 07/21/15 2152    Lab Results: No results found for this or any previous visit (from the past 48 hour(s)).  Blood Alcohol level:  Lab Results  Component Value Date   ETH 386* 07/17/2015    Metabolic Disorder Labs: No results found for: HGBA1C, MPG No results found for: PROLACTIN No results found for: CHOL, TRIG,  HDL, CHOLHDL, VLDL, LDLCALC  Physical Findings: AIMS: Facial and Oral Movements Muscles of Facial Expression: None, normal Lips and Perioral Area: None, normal Jaw: None, normal Tongue: None, normal,Extremity Movements Upper (arms, wrists, hands, fingers): None, normal Lower (legs, knees, ankles, toes): None, normal, Trunk Movements Neck, shoulders, hips: None, normal, Overall Severity Severity of abnormal movements (highest score from questions above): None, normal Incapacitation due to abnormal movements: None, normal Patient's awareness of abnormal movements (rate only patient's report): No Awareness, Dental Status Current problems with teeth and/or dentures?: No Does patient usually wear dentures?: No  CIWA:  CIWA-Ar Total: 1 COWS:  COWS Total Score: 0  Musculoskeletal: Strength & Muscle Tone: within normal limits Gait & Station: normal Patient leans: N/A  Psychiatric Specialty Exam: Physical Exam  Review of Systems  Neurological: Positive for tremors (Mild ).  Psychiatric/Behavioral: Positive for depression and substance abuse. Negative for suicidal ideas, hallucinations and memory loss. The patient is nervous/anxious and has insomnia.     Blood pressure 132/88, pulse 96, temperature 97.7 F (36.5 C), temperature source Oral, resp. rate 16, height 5\' 5"  (1.651 m), weight 64.864 kg (143 lb), SpO2 98 %.Body mass index is 23.8 kg/(m^2).  General Appearance: Casual  Eye Contact:  Good  Speech:  Normal Rate  Volume:  Normal  Mood:  Less depressed   Affect:  Less constricted, still anxious   Thought Process:  Linear  Orientation:  Full (Time, Place, and Person)  Thought Content:  Concerns about finances, housing- no hallucinations , no delusions   Suicidal Thoughts:  No denies suicidal ideations or self injurious ideations at this time   Homicidal Thoughts:  No  Memory:  Immediate;   Good Recent;   Good Remote;   Good  Judgement:  Improving   Insight:  Improving    Psychomotor Activity:  Normal  Concentration:  Concentration: Good and Attention Span: Good  Recall:  Good  Fund of Knowledge:  Good  Language:  Good  Akathisia:  Negative  Handed:  Right  AIMS (if indicated):     Assets:  Communication Skills Desire for Improvement Leisure Time Resilience Vocational/Educational  ADL's:  Intact  Cognition:  WNL  Sleep:  Number of Hours: 6.75   Assessment - patient presenting with partial improvement - less depressed, less anxious, and currently more future oriented. Currently no severe /protracted symptoms of alcohol withdrawal noted or reported. Tolerating Prozac well thus far. Interested in Campral trial to address alcohol cravings- side effects reviewed   Treatment Plan Summary: Daily contact with patient to assess and evaluate symptoms and progress in treatment and Medication management  Encourage ongoing group /milieu participation to work on  coping skills and symptom reduction  Continue to encourage efforts to maintain sobriety, relapse prevention efforts . Continue the  Prozac 20  mg daily for symptoms of depression/anxiety Continue Trazodone at 100 mg hs for insomnia.  Completing Ativan detox/taper due to report from patient of daily alcohol intake over the last six months.  Continue the Campral 666 mgrs TID for alcohol cravings Treatment team working on disposition planning.  Sanjuana Kava, NP, PMHNP, FNP-BC 07/22/2015, 5:03 PM Agree with NP Progress Note, as above

## 2015-07-22 NOTE — Progress Notes (Signed)
Patient did attend the evening karaoke group. Pt was engaged, supportive, and participated by singing a song.  

## 2015-07-23 MED ORDER — ATENOLOL 50 MG PO TABS
50.0000 mg | ORAL_TABLET | Freq: Once | ORAL | Status: DC
Start: 2015-07-23 — End: 2015-07-23
  Filled 2015-07-23: qty 1

## 2015-07-23 MED ORDER — ONDANSETRON 4 MG PO TBDP: 4.0000 mg | ORAL_TABLET | Freq: Four times a day (QID) | ORAL | Status: DC | PRN

## 2015-07-23 MED ORDER — LOPERAMIDE HCL 2 MG PO CAPS: 2.0000 mg | ORAL_CAPSULE | ORAL | Status: DC | PRN

## 2015-07-23 MED ORDER — HYDROXYZINE HCL 25 MG PO TABS
25.0000 mg | ORAL_TABLET | Freq: Four times a day (QID) | ORAL | Status: DC | PRN
  Filled 2015-07-23: qty 10

## 2015-07-23 MED ORDER — CHLORDIAZEPOXIDE HCL 25 MG PO CAPS
25.0000 mg | ORAL_CAPSULE | Freq: Four times a day (QID) | ORAL | Status: DC | PRN
  Administered 2015-07-23 – 2015-07-24 (×2): 25 mg via ORAL
  Filled 2015-07-23 (×2): qty 1

## 2015-07-23 NOTE — Progress Notes (Signed)
DAR NOTE: Pt present with bright affect and jovial mood in the unit. Pt has been observed in the milieu interacting with peers. Pt denies physical pain, took all his meds as scheduled. As per self inventory, pt had a good night sleep, good appetite, normal energy, and good concentration. Pt rate depression at 4, hopeless ness at 4, and anxiety at 4. Pt's safety ensured with 15 minute and environmental checks. Pt currently denies SI/HI and A/V hallucinations. Pt verbally agrees to seek staff if SI/HI or A/VH occurs and to consult with staff before acting on these thoughts. Will continue POC.

## 2015-07-23 NOTE — Progress Notes (Signed)
Patient ID: Brandn Mcgath, male   DOB: [birth year], 45 y.o.   MRN: [id number] Patient ID: Abrham Maslowski, male   DOB: 1970/08/07, 45 y.o.   MRN: [id number] Patient ID: Dave Mergen, male   DOB: 1970/09/18, 45 y.o.   MRN: [id number] [facility] MD Progress Note  07/23/2015 4:04 PM Moe Graca  MRN:  [id number]  Subjective:  Patient states, "The suicidal thoughts are gradually improving, I'm doing a lot better. My depression is probably at #4 today".  Objective:  Patient is seen and chart is reviewed.  Patient still somewhat continues to endorse some depression, but says he is improving gradually. He is hopeful that his insomnia, which has been major issue for him recently, is improving too . No tremors, no diaphoresis, no acute distress or restlessness, vitals are stable.No disruptive or agitated behaviors on unit, going to some groups, visible in day room.   Principal Problem: Major depressive disorder, recurrent severe without psychotic features (HCC) Diagnosis:   Patient Active Problem List   Diagnosis Date Noted  . Alcohol use disorder, severe, dependence (HCC) [F10.20] 07/18/2015  . MDD (major depressive disorder), severe (HCC) [F32.2] 07/18/2015  . Major depressive disorder, recurrent severe without psychotic features (HCC) [F33.2] 07/18/2015  . 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 Sleep apnea     Past Surgical History  Procedure Laterality Date  . Carpal tunnel release     Family History: History reviewed. No pertinent family history.  Family Psychiatric  History: See H & P  Social History:  History  Alcohol Use  . Yes    Comment: 12 40 oz of Icehouse beer weekly     History  Drug Use No    Social History   Social History  . Marital Status: Single    Spouse Name: N/A  . Number  of Children: N/A  . Years of Education: N/A   Social History Main Topics  . Smoking status: Never Smoker   . Smokeless tobacco: None  . Alcohol Use: Yes     Comment: 12 40 oz of Icehouse beer weekly  . Drug Use: No  . Sexual Activity: Not Asked   Other Topics Concern  . None   Social History Narrative   Additional Social History:   Sleep: Improving  Appetite:  Good  Current Medications: Current Facility-Administered Medications  Medication Dose Route Frequency Provider Last Rate Last Dose  . acamprosate (CAMPRAL) tablet 666 mg  666 mg Oral TID WC Craige Cotta, MD   666 mg at 07/23/15 1146  . acetaminophen (TYLENOL) tablet 650 mg  650 mg Oral Q6H PRN Charm Rings, NP      . alum & mag hydroxide-simeth (MAALOX/MYLANTA) 200-200-20 MG/5ML suspension 30 mL  30 mL Oral Q4H PRN Charm Rings, NP      . FLUoxetine (PROZAC) capsule 10 mg  10 mg Oral Daily Charm Rings, NP   10 mg at 07/23/15 0800  . magnesium hydroxide (MILK OF MAGNESIA) suspension 30 mL  30 mL Oral Daily PRN Charm Rings, NP      . multivitamin with minerals tablet 1 tablet  1 tablet Oral Daily Sanjuana Kava, NP   1 tablet at 07/23/15 0800  . thiamine (VITAMIN B-1) tablet 100 mg  100 mg Oral  Daily Sanjuana Kava, NP   100 mg at 07/23/15 0800  . traZODone (DESYREL) tablet 100 mg  100 mg Oral QHS,MR X 1 Craige Cotta, MD   100 mg at 07/22/15 2232   Lab Results: No results found for this or any previous visit (from the past 48 hour(s)).  Blood Alcohol level:  Lab Results  Component Value Date   ETH 386* 07/17/2015   Metabolic Disorder Labs: No results found for: HGBA1C, MPG No results found for: PROLACTIN No results found for: CHOL, TRIG, HDL, CHOLHDL, VLDL, LDLCALC  Physical Findings: AIMS: Facial and Oral Movements Muscles of Facial Expression: None, normal Lips and Perioral Area: None, normal Jaw: None, normal Tongue: None, normal,Extremity Movements Upper (arms, wrists, hands, fingers): None,  normal Lower (legs, knees, ankles, toes): None, normal, Trunk Movements Neck, shoulders, hips: None, normal, Overall Severity Severity of abnormal movements (highest score from questions above): None, normal Incapacitation due to abnormal movements: None, normal Patient's awareness of abnormal movements (rate only patient's report): No Awareness, Dental Status Current problems with teeth and/or dentures?: No Does patient usually wear dentures?: No  CIWA:  CIWA-Ar Total: 0 COWS:  COWS Total Score: 0  Musculoskeletal: Strength & Muscle Tone: within normal limits Gait & Station: normal Patient leans: N/A  Psychiatric Specialty Exam: Physical Exam  Review of Systems  Neurological: Positive for tremors (Mild ).  Psychiatric/Behavioral: Positive for depression and substance abuse. Negative for suicidal ideas, hallucinations and memory loss. The patient is nervous/anxious and has insomnia.     Blood pressure 141/94, pulse 98, temperature 97.5 F (36.4 C), temperature source Oral, resp. rate 16, height 5\' 5"  (1.651 m), weight 64.864 kg (143 lb), SpO2 98 %.Body mass index is 23.8 kg/(m^2).  General Appearance: Casual  Eye Contact:  Good  Speech:  Normal Rate  Volume:  Normal  Mood:  Less depressed   Affect:  Less constricted, still anxious   Thought Process:  Linear  Orientation:  Full (Time, Place, and Person)  Thought Content:  Concerns about finances, housing- no hallucinations , no delusions   Suicidal Thoughts:  No denies suicidal ideations or self injurious ideations at this time   Homicidal Thoughts:  No  Memory:  Immediate;   Good Recent;   Good Remote;   Good  Judgement:  Improving   Insight:  Improving   Psychomotor Activity:  Normal  Concentration:  Concentration: Good and Attention Span: Good  Recall:  Good  Fund of Knowledge:  Good  Language:  Good  Akathisia:  Negative  Handed:  Right  AIMS (if indicated):     Assets:  Communication Skills Desire for  Improvement Leisure Time Resilience Vocational/Educational  ADL's:  Intact  Cognition:  WNL  Sleep:  Number of Hours: 6   Assessment - Patient presenting with partial improvement - less depressed, less anxious, and currently more future oriented. Currently no severe /protracted symptoms of alcohol withdrawal noted or reported. Tolerating Prozac well thus far. Interested in Campral trial to address alcohol cravings- side effects reviewed   Treatment Plan Summary: Daily contact with patient to assess and evaluate symptoms and progress in treatment and Medication management  Encourage ongoing group /milieu participation to work on coping skills and symptom reduction  Continue to encourage efforts to maintain sobriety, relapse prevention efforts . Continue the Prozac 20  mg daily for symptoms of depression/anxiety Continue Trazodone at 100 mg hs for insomnia.  Continue the Campral 666 mgrs TID for alcohol cravings Treatment team working on  disposition planning.  Sanjuana KavaNwoko, Agnes I, NP, PMHNP, FNP-BC 07/23/2015, 4:04 PM Agree with NP Progress Note, as above

## 2015-07-23 NOTE — [facility] Group Notes (Signed)
[facility] LCSW Group Therapy  07/23/2015 12:47 PM  Type of Therapy:  Group Therapy  Participation Level:  Minimal  Participation Quality:  Attentive  Affect:  Appropriate  Cognitive:  Oriented  Insight:  Limited  Engagement in Therapy:  Limited  Modes of Intervention:  Confrontation, Discussion, Education, Exploration, Problem-solving, Rapport Building, Socialization and Support  Summary of Progress/Problems: Feelings around Relapse. Group members discussed the meaning of relapse and shared personal stories of relapse, how it affected them and others, and how they perceived themselves during this time. Group members were encouraged to identify triggers, warning signs and coping skills used when facing the possibility of relapse. Social supports were discussed and explored in detail. Post Acute Withdrawal Syndrome (handout provided) was introduced and examined. Pt's were encouraged to ask questions, talk about key points associated with PAWS, and process this information in terms of relapse prevention. Casimiro NeedleMichael was attentive during group and actively listened as others shared, however, he did not actively talk unless asked a direct question by CSW. Casimiro NeedleMichael reports that he uses alcohol to numb his depression and thinks that taking medication and finding a good support group will help him overcome this addiction. Casimiro NeedleMichael continues to show some progress in the group setting and presents with pleasant mood and calm affect.   Smart, Alonda Weaber LCSW 07/23/2015, 12:47 PM

## 2015-07-23 NOTE — Progress Notes (Signed)
Patient did attend the evening speaker AA meeting.  

## 2015-07-23 NOTE — Tx Team (Signed)
Interdisciplinary Treatment Plan Update (Adult) Date: 07/23/2015    Time Reviewed: 9:30 AM  Progress in Treatment: Attending groups: Yes Participating in groups: Yes Taking medication as prescribed: Yes Tolerating medication: Yes Family/Significant other contact made: [name] completed with pt; pt declined to consent to family contact.  Patient understands diagnosis: Yes Discussing patient identified problems/goals with staff: Yes Medical problems stabilized or resolved: Yes Denies suicidal/homicidal ideation: Yes Issues/concerns per patient self-inventory: Yes Other:  New problem(s) identified: N/A  Discharge Plan or Barriers: Home with outpatient services at [facility]. Pt assessed for TCT by [name]. Pt plans to continue to follow-up with [name] at discharge. He was also given: [employer] pamphlet and boarding house/extended stay hotel list.   Reason for Continuation of Hospitalization:  Medication Stabilization   Comments: N/A  Estimated length of stay: 2-3 days (tentative discharge Monday)     Patient is a 45 year old male admitted with suicidal ideations, depression, and alcohol abuse. Pt reports primary trigger(s) for admission include eviction from boarding house due to difficulty paying rent, and financial stressors. Patient plans to return to previous living situation temporarily before locating alternative housing. He plans to follow up with [facility]. Patient will benefit from crisis stabilization, medication evaluation, group therapy and psycho education in addition to case management for discharge planning. At discharge, it is recommended that Pt remain compliant with established discharge plan and continued treatment.   Review of initial/current patient goals per problem list:  1. Goal(s): Patient will participate in aftercare plan   Met: Yes   Target date: 3-5 days post admission date   As evidenced by: Patient will participate within aftercare plan  AEB aftercare provider and housing plan at discharge being identified.  7/10: Goal met. Patient plans to return home to follow up with outpatient services.    2. Goal (s): Patient will exhibit decreased depressive symptoms and suicidal ideations.   Met: Yes-adequate for discharge    Target date: 3-5 days post admission date   As evidenced by: Patient will utilize self rating of depression at 3 or below and demonstrate decreased signs of depression or be deemed stable for discharge by MD.  7/10: Goal progressing. Patient admitted with high depression level.  7/14: Goal met/adequate for discharge. Pt rates depression as 4 and presents with pleasant mood/calm affect. Denies SI/HI/AVH. He appears to be presenting at his baseline.    3. Goal(s): Patient will demonstrate decreased signs of withdrawal due to substance abuse   Met: Yes   Target date: 3-5 days post admission date   As evidenced by: Patient will produce a CIWA/COWS score of 0, have stable vitals signs, and no symptoms of withdrawal  7/10: Goal not met: Pt continues to have withdrawal symptoms of headache, anxiety, and sweating and a CIWA score of a 3.  Pt to show decrease withdrawal symptoms prior to d/c.  7/14: Goal met. Pt reports no signs of withdrawal with CIWA/COWS of 0. High BP. Per MD, pt is medically stable for discharge. All other vitals are stable.     Attendees: Patient:    Family:    Physician: Dr. Elenora Fender 07/23/2015 10:18 AM   Nursing: Precious Gilding RN  07/23/2015 10:18 AM   Clinical Social Worker: Erasmo Downer Drinkard, LCSW 07/23/2015 10:18 AM   Other: Peri Maris, LCSWA 07/23/2015 10:18 AM   Other: Maxie Better, LCSW 07/23/2015 10:18 AM   Other:    Other: Agustina Caroli NP  07/23/2015 10:18 AM   Other:    Other:  Scribe for Treatment Team:  Maxie Better, MSW, LCSW Clinical Social Worker 07/23/2015 10:19 AM

## 2015-07-23 NOTE — [facility] Group Notes (Signed)
[facility]   07/23/2015 9:49 AM  Participation Quality:  Appropriate   Mood/Affect:  Appropriate  Depression Rating:  x  Anxiety Rating:  1-2  Thoughts of Suicide:  No Will you contract for safety?   NA  Current AVH:  No  Plan for Discharge/Comments:  Pt reports that he plans to return to boarding house until he gets evicted and [name] Pt provided with extended stay hotel list and boarding house list. He reports that he slept well last night and is starting to feel better.   Transportation Means: [employer]   Supports: TCT/no other identified supports.   Smart, Jenene Kauffmann LCSW

## 2015-07-23 NOTE — Progress Notes (Signed)
Nursing Note 07/22/15 1900 - 07/23/15 0730  Data Patient being monitored inpatient for alcohol detox and suicide attempt prior to admission.  Denied active SI, HI, and AVH- agreed to come to staff if this changes.  Bright affect, happy mood during assessment.  Stated "I talked to [facility] today and they're going to help me with the housing."  Denies physical complaints, withdrawal symptoms.   Action Spoke with patient 1:1, offered to be a support through duration of shift.  Remained on 15 minute checks for safety.  Response Remains safe on unit, appears to be asleep at this time.

## 2015-07-23 NOTE — Progress Notes (Signed)
Pt requested that pt contact his father "to give him an update on how I'm doing." CSW left message for pt's father on his cell: [phone]) [name], and requested call back at his earliest convenience.  Trula SladeHeather Smart, MSW, LCSW Clinical Social Worker 07/23/2015 4:14 PM

## 2015-07-23 NOTE — Progress Notes (Signed)
Recreation Therapy Notes  Date: 07.14.2017 Time: 9:30am Location: [address] Group Room   Group Topic: Stress Management  Goal Area(s) Addresses:  Patient will actively participate in stress management techniques presented during session.   Behavioral Response: Engaged, Attentive  Intervention: Stress management techniques  Activity : Deep Breathing and Progressive Body Relaxation. LRT provided education, instruction and demonstration on practice of Deep Breathing and Progressive Body Relaxation. Patient was asked to participate in technique introduced during session.   Education:  Stress Management, Discharge Planning.   Education Outcome: Acknowledges education  Clinical Observations/Feedback: Patient actively engaged in technique introduced, expressed no concerns and demonstrated ability to practice independently post d/c.   Lashayla Armes L Meloney Feld, LRT/CTRS        Maely Clements L 07/23/2015 6:08 PM 

## 2015-07-24 MED ORDER — CLONIDINE HCL 0.1 MG PO TABS
0.1000 mg | ORAL_TABLET | Freq: Three times a day (TID) | ORAL | Status: DC | PRN
  Administered 2015-07-24: 0.1 mg via ORAL
  Filled 2015-07-24: qty 1

## 2015-07-24 NOTE — Progress Notes (Signed)
Patient did attend the evening speaker AA meeting.  

## 2015-07-24 NOTE — [facility] Group Notes (Signed)
  [facility] LCSW Group Therapy Note  07/24/2015 at 10:00 AM  Type of Therapy and Topic:  Group Therapy: Avoiding Self-Sabotaging and Enabling Behaviors  Participation Level:  Minimal  Participation Quality:  Attentive and Sharing  Affect:  Excited  Cognitive:  Alert and Oriented  Insight:  Limited  Engagement in Therapy:  Limited   Therapeutic models used: Cognitive Behavioral Therapy,  Person-Centered Therapy and Motivational Interviewing  Modes of Intervention:  Discussion, Exploration, Orientation, Rapport Building, Socialization and Support   Summary of Progress/Problems:  The main focus of today's process group was for the patient to identify ways in which they have in the past sabotaged their own recovery. Motivational Interviewing was utilized to identify motivation they may have for wanting to change. Levi NovemberMike was attentive to most of group but shared only experiences he enjoyed while using alcohol. Others in group challenged Levi NovemberMike to inquire if he wants to stop and patient replied "I guess you should say yes when you are here."   Carney Bernatherine C Harrill, LCSW

## 2015-07-24 NOTE — Progress Notes (Signed)
Patient has been up and active on the unit, attended AA meeting this evening and has voiced no complaints. His blood pressure was elevated this evening and an order was received for librium protocol. Patient currently denies having pain, -si/hi/a/v hall. Support and encouragement offered, safety maintained on unit, will continue to monitor.

## 2015-07-24 NOTE — Progress Notes (Signed)
D: Patient's self inventory sheet: patient has good sleep, recieved sleep medication.good  Appetite, normal energy level, good concentration. Rated depression 3/10, hopeless 3/10, anxiety 3/10. SI/HI/AVH: denies all. Physical complaints are blurred vision. Goal is "stay positive". Plans to work on "no negative thoughts".   A: Medications administered, assessed medication knowledge and education given on medication regimen.  Emotional support and encouragement given patient. R: Denies SI and HI , contracts for safety. Safety maintained with 15 minute checks.

## 2015-07-24 NOTE — Progress Notes (Signed)
[facility] MD Progress Note  07/24/2015 3:36 PM Levi Welch  MRN:  [id number]  Subjective:  Patient states " I am going back to the group home before I start treatment with [facility]."  Objective:  Truth Barot is awake, alert and oriented X4. Seeing sitting in dayroom interacting with peers. Denies suicidal or homicidal ideation. Denies auditory or visual hallucination and does not appear to be responding to internal stimuli. Patient reports interacting well with staff and others. Patient reports he is medication compliant without mediation side effects. Patient reports the social worker told him that he can leave on Monday. Patient has concerns about how to apply for disability/ medicaid.  States his depression 2/10. Reports good appetite and states he is resting well. Support, encouragement and reassurance was provided.    Principal Problem: Major depressive disorder, recurrent severe without psychotic features (HCC) Diagnosis:   Patient Active Problem List   Diagnosis Date Noted  . Alcohol use disorder, severe, dependence (HCC) [F10.20] 07/18/2015  . MDD (major depressive disorder), severe (HCC) [F32.2] 07/18/2015  . Major depressive disorder, recurrent severe without psychotic features (HCC) [F33.2] 07/18/2015  . 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 Sleep apnea     Past Surgical History  Procedure Laterality Date  . Carpal tunnel release     Family History: History reviewed. No pertinent family history.  Family Psychiatric  History: See H & P  Social History:  History  Alcohol Use  . Yes    Comment: 12 40 oz of Icehouse beer weekly     History  Drug Use No    Social History   Social History  . Marital Status: Single    Spouse Name: N/A  . Number of Children: N/A  . Years of  Education: N/A   Social History Main Topics  . Smoking status: Never Smoker   . Smokeless tobacco: None  . Alcohol Use: Yes     Comment: 12 40 oz of Icehouse beer weekly  . Drug Use: No  . Sexual Activity: Not Asked   Other Topics Concern  . None   Social History Narrative   Additional Social History:   Sleep: Improving  Appetite:  Good  Current Medications: Current Facility-Administered Medications  Medication Dose Route Frequency Provider Last Rate Last Dose  . acamprosate (CAMPRAL) tablet 666 mg  666 mg Oral TID WC Craige Cotta, MD   666 mg at 07/24/15 1153  . acetaminophen (TYLENOL) tablet 650 mg  650 mg Oral Q6H PRN Charm Rings, NP      . alum & mag hydroxide-simeth (MAALOX/MYLANTA) 200-200-20 MG/5ML suspension 30 mL  30 mL Oral Q4H PRN Charm Rings, NP      . chlordiazePOXIDE (LIBRIUM) capsule 25 mg  25 mg Oral Q6H PRN Court Joy, PA-C   25 mg at 07/24/15 1153  . cloNIDine (CATAPRES) tablet 0.1 mg  0.1 mg Oral TID PRN Oneta Rack, NP      . FLUoxetine (PROZAC) capsule 10 mg  10 mg Oral Daily Charm Rings, NP   10 mg at 07/24/15 0848  . hydrOXYzine (ATARAX/VISTARIL) tablet 25 mg  25 mg Oral Q6H PRN Court Joy, PA-C      . loperamide (IMODIUM) capsule 2-4 mg  2-4 mg Oral  PRN Court Joy, PA-C      . magnesium hydroxide (MILK OF MAGNESIA) suspension 30 mL  30 mL Oral Daily PRN Charm Rings, NP      . multivitamin with minerals tablet 1 tablet  1 tablet Oral Daily Sanjuana Kava, NP   1 tablet at 07/24/15 0848  . ondansetron (ZOFRAN-ODT) disintegrating tablet 4 mg  4 mg Oral Q6H PRN Court Joy, PA-C      . thiamine (VITAMIN B-1) tablet 100 mg  100 mg Oral Daily Sanjuana Kava, NP   100 mg at 07/24/15 0848  . traZODone (DESYREL) tablet 100 mg  100 mg Oral QHS,MR X 1 Craige Cotta, MD   100 mg at 07/23/15 2253   Lab Results: No results found for this or any previous visit (from the past 48 hour(s)).  Blood Alcohol level:  Lab Results   Component Value Date   ETH 386* 07/17/2015   Metabolic Disorder Labs: No results found for: HGBA1C, MPG No results found for: PROLACTIN No results found for: CHOL, TRIG, HDL, CHOLHDL, VLDL, LDLCALC  Physical Findings: AIMS: Facial and Oral Movements Muscles of Facial Expression: None, normal Lips and Perioral Area: None, normal Jaw: None, normal Tongue: None, normal,Extremity Movements Upper (arms, wrists, hands, fingers): None, normal Lower (legs, knees, ankles, toes): None, normal, Trunk Movements Neck, shoulders, hips: None, normal, Overall Severity Severity of abnormal movements (highest score from questions above): None, normal Incapacitation due to abnormal movements: None, normal Patient's awareness of abnormal movements (rate only patient's report): No Awareness, Dental Status Current problems with teeth and/or dentures?: No Does patient usually wear dentures?: No  CIWA:  CIWA-Ar Total: 0 COWS:  COWS Total Score: 0  Musculoskeletal: Strength & Muscle Tone: within normal limits Gait & Station: normal Patient leans: N/A  Psychiatric Specialty Exam: Physical Exam  Nursing note and vitals reviewed. Constitutional: He is oriented to person, place, and time. He appears well-developed.  Musculoskeletal: Normal range of motion.  Neurological: He is alert and oriented to person, place, and time.  Psychiatric: He has a normal mood and affect. His behavior is normal.    Review of Systems  Neurological: Negative for tremors (Mild ).  Psychiatric/Behavioral: Positive for depression and substance abuse. Negative for suicidal ideas, hallucinations and memory loss. The patient is nervous/anxious and has insomnia.   All other systems reviewed and are negative.   Blood pressure 141/91, pulse 82, temperature 97.5 F (36.4 C), temperature source Oral, resp. rate 16, height  (1.651 m), weight 64.864 kg (143 lb), SpO2 98 %.Body mass index is 23.8 kg/(m^2).  General Appearance:  Casual  Eye Contact:  Good  Speech:  Normal Rate  Volume:  Normal  Mood:  Less depressed   Affect:  Less constricted, still anxious   Thought Process:  Linear  Orientation:  Full (Time, Place, and Person)  Thought Content:  Concerns about finances, housing- no hallucinations no delusions   Suicidal Thoughts:  No denies suicidal ideations or self injurious ideations at this time   Homicidal Thoughts:  No  Memory:  Immediate;   Good Recent;   Good Remote;   Good  Judgement:  Improving   Insight:  Improving   Psychomotor Activity:  Normal  Concentration:  Concentration: Good and Attention Span: Good  Recall:  Good  Fund of Knowledge:  Good  Language:  Good  Akathisia:  Negative  Handed:  Right  AIMS (if indicated):     Assets:  Desire for  Improvement Financial Resources/Insurance Leisure Time Resilience Social Support Vocational/Educational  ADL's:  Intact  Cognition:  WNL  Sleep:  Number of Hours: 6    I agree with current treatment plan on 07/24/2015, Patient seen face-to-face for psychiatric evaluation follow-up, chart reviewed. Reviewed the information documented and agree with the treatment plan.  Treatment Plan Summary: Daily contact with patient to assess and evaluate symptoms and progress in treatment and Medication management  Encourage ongoing group /milieu participation to work on coping skills and symptom reduction  Continue to encourage efforts to maintain sobriety, relapse prevention efforts . Continue the Prozac 20  mg daily for symptoms of depression/anxiety Continue Trazodone at 100 mg hs for insomnia.  Continue the Campral 666 mgrs TID for alcohol cravings Treatment team working on disposition planning.  Oneta Rackanika N Lewis, NP 07/24/2015, 3:36 PM Agree with NP Progress Note, as above

## 2015-07-25 NOTE — [facility] Group Notes (Signed)
[facility] Group Notes:  (Nursing/MHT/Case Management/Adjunct)  Date:  07/25/2015  Time:  12:56 PM  Type of Therapy:  Psychoeducational Skills  Participation Level:  Active  Participation Quality:  Appropriate, Sharing and Supportive  Affect:  Appropriate  Cognitive:  Alert, Appropriate and Oriented  Insight:  Improving  Engagement in Group:  Engaged and Supportive  Modes of Intervention:  Discussion, Exploration and Support  Summary of Progress/Problems: Patient attended group and actively participated; discussed support systems and the need for positive support in the community, as well as support groups.  Levi BergamoBarbara M Santa Welch 07/25/2015, 12:56 PM

## 2015-07-25 NOTE — Progress Notes (Signed)
[facility] MD Progress Note  07/25/2015 11:23 AM Levi Welch  MRN:  [id number]  Subjective:  Patient states " I was able to get in touch with my family and I am happy to learn that I still have a good support system."  Patient reports" I was worried that one cared about me.'  Objective:  Levi Welch is awake, alert and oriented X4. Seeing sitting in dayroom interacting with peers and coloring pages. Patient reports working for [employer] and was able to speak with his manager who was really supportive. Denies suicidal or homicidal ideation. Denies auditory or visual hallucination and does not appear to be responding to internal stimuli. Patient reports interacting well with staff and others. Patient reports he is medication compliant without mediation side effects. Patient reports the Social worker  will have to follow-up with [facility] so that he is able to get a ride back home. . Patient still has concerns about how to apply for disability/medicaid.  States his depression 2/10. Reports good appetite and states he is resting well. Support, encouragement and reassurance was provided.    Principal Problem: Major depressive disorder, recurrent severe without psychotic features (HCC) Diagnosis:   Patient Active Problem List   Diagnosis Date Noted  . Alcohol use disorder, severe, dependence (HCC) [F10.20] 07/18/2015  . MDD (major depressive disorder), severe (HCC) [F32.2] 07/18/2015  . Major depressive disorder, recurrent severe without psychotic features (HCC) [F33.2] 07/18/2015  . 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 Sleep apnea     Past Surgical History  Procedure Laterality Date  . Carpal tunnel release     Family History: History reviewed. No pertinent family history.  Family Psychiatric   History: See H & P  Social History:  History  Alcohol Use  . Yes    Comment: 12 40 oz of Icehouse beer weekly     History  Drug Use No    Social History   Social History  . Marital Status: Single    Spouse Name: N/A  . Number of Children: N/A  . Years of Education: N/A   Social History Main Topics  . Smoking status: Never Smoker   . Smokeless tobacco: None  . Alcohol Use: Yes     Comment: 12 40 oz of Icehouse beer weekly  . Drug Use: No  . Sexual Activity: Not Asked   Other Topics Concern  . None   Social History Narrative   Additional Social History:   Sleep: Improving  Appetite:  Good  Current Medications: Current Facility-Administered Medications  Medication Dose Route Frequency Provider Last Rate Last Dose  . acamprosate (CAMPRAL) tablet 666 mg  666 mg Oral TID WC Craige Cotta, MD   666 mg at 07/25/15 4098  . acetaminophen (TYLENOL) tablet 650 mg  650 mg Oral Q6H PRN Charm Rings, NP      . alum & mag hydroxide-simeth (MAALOX/MYLANTA) 200-200-20 MG/5ML suspension 30 mL  30 mL Oral Q4H PRN Charm Rings, NP      . chlordiazePOXIDE (LIBRIUM) capsule 25 mg  25 mg Oral Q6H PRN Court Joy, PA-C   25 mg at 07/24/15 1153  . cloNIDine (CATAPRES) tablet 0.1 mg  0.1 mg Oral TID PRN Oneta Rack, NP   0.1 mg at 07/24/15  1650  . FLUoxetine (PROZAC) capsule 10 mg  10 mg Oral Daily Charm RingsJamison Y Lord, NP   10 mg at 07/25/15 0753  . hydrOXYzine (ATARAX/VISTARIL) tablet 25 mg  25 mg Oral Q6H PRN Court Joyharles E Kober, PA-C      . loperamide (IMODIUM) capsule 2-4 mg  2-4 mg Oral PRN Court Joyharles E Kober, PA-C      . magnesium hydroxide (MILK OF MAGNESIA) suspension 30 mL  30 mL Oral Daily PRN Charm RingsJamison Y Lord, NP      . multivitamin with minerals tablet 1 tablet  1 tablet Oral Daily Sanjuana KavaAgnes I Nwoko, NP   1 tablet at 07/25/15 0753  . ondansetron (ZOFRAN-ODT) disintegrating tablet 4 mg  4 mg Oral Q6H PRN Court Joyharles E Kober, PA-C      . thiamine (VITAMIN B-1) tablet 100 mg  100 mg Oral  Daily Sanjuana KavaAgnes I Nwoko, NP   100 mg at 07/25/15 0753  . traZODone (DESYREL) tablet 100 mg  100 mg Oral QHS,MR X 1 Craige CottaFernando A Timmey Lamba, MD   100 mg at 07/24/15 2233   Lab Results: No results found for this or any previous visit (from the past 48 hour(s)).  Blood Alcohol level:  Lab Results  Component Value Date   ETH 386* 07/17/2015   Metabolic Disorder Labs: No results found for: HGBA1C, MPG No results found for: PROLACTIN No results found for: CHOL, TRIG, HDL, CHOLHDL, VLDL, LDLCALC  Physical Findings: AIMS: Facial and Oral Movements Muscles of Facial Expression: None, normal Lips and Perioral Area: None, normal Jaw: None, normal Tongue: None, normal,Extremity Movements Upper (arms, wrists, hands, fingers): None, normal Lower (legs, knees, ankles, toes): None, normal, Trunk Movements Neck, shoulders, hips: None, normal, Overall Severity Severity of abnormal movements (highest score from questions above): None, normal Incapacitation due to abnormal movements: None, normal Patient's awareness of abnormal movements (rate only patient's report): No Awareness, Dental Status Current problems with teeth and/or dentures?: No Does patient usually wear dentures?: No  CIWA:  CIWA-Ar Total: 2 COWS:  COWS Total Score: 0  Musculoskeletal: Strength & Muscle Tone: within normal limits Gait & Station: normal Patient leans: N/A  Psychiatric Specialty Exam: Physical Exam  Nursing note and vitals reviewed. Constitutional: He is oriented to person, place, and time. He appears well-developed.  Musculoskeletal: Normal range of motion.  Neurological: He is alert and oriented to person, place, and time.  Psychiatric: He has a normal mood and affect. His behavior is normal.    Review of Systems  Neurological: Negative for tremors (Mild ).  Psychiatric/Behavioral: Positive for depression (stable) and substance abuse. Negative for suicidal ideas, hallucinations and memory loss. The patient is  nervous/anxious. The patient does not have insomnia.   All other systems reviewed and are negative.   Blood pressure 118/80, pulse 77, temperature 97.6 F (36.4 C), temperature source Oral, resp. rate 16, height 5\' 5"  (1.651 m), weight 64.864 kg (143 lb), SpO2 98 %.Body mass index is 23.8 kg/(m^2).  General Appearance: Casual  Eye Contact:  Good  Speech:  Normal Rate  Volume:  Normal  Mood:  Less depressed   Affect:  Less constricted, still anxious   Thought Process:  Linear  Orientation:  Full (Time, Place, and Person)  Thought Content:  Concerns about finances, housing- no hallucinations no delusions   Suicidal Thoughts:  No denies suicidal ideations or self injurious ideations at this time   Homicidal Thoughts:  No  Memory:  Immediate;   Good Recent;   Good Remote;  Good  Judgement:  Improving   Insight:  Improving   Psychomotor Activity:  Normal  Concentration:  Concentration: Good and Attention Span: Good  Recall:  Good  Fund of Knowledge:  Good  Language:  Good  Akathisia:  Negative  Handed:  Right  AIMS (if indicated):     Assets:  Desire for Improvement Financial Resources/Insurance Leisure Time Resilience Social Support Vocational/Educational  ADL's:  Intact  Cognition:  WNL  Sleep:  Number of Hours: 6.75    I agree with current treatment plan on 07/25/2015, Patient seen face-to-face for psychiatric evaluation follow-up, chart reviewed. Reviewed the information documented and agree with the treatment plan.  Treatment Plan Summary: Daily contact with patient to assess and evaluate symptoms and progress in treatment and Medication management  Encourage ongoing group /milieu participation to work on coping skills and symptom reduction  Continue to encourage efforts to maintain sobriety, relapse prevention efforts . Continue the Prozac 20 mg daily for symptoms of depression/anxiety Continue Trazodone at 100 mg hs for insomnia.  Continue the Campral 666 mgrs TID  for alcohol cravings Treatment team working on disposition planning.  Oneta Rack, NP 07/25/2015, 11:23 AM Agree with NP Progress Note, as above

## 2015-07-25 NOTE — Progress Notes (Signed)
Patient has been up and active on the unit, attended group this evening and has voiced no complaints. Patient currently denies having pain, -si/hi/a/v hall.He is looking forward to discharge on Monday. Support and encouragement offered, safety maintained on unit, will continue to monitor.

## 2015-07-25 NOTE — [facility] Group Notes (Signed)
[facility] LCSW Group Therapy  07/25/2015 10:10 to 11:05 AM  Type of Therapy:  Group Therapy  Participation Level:  Active  Participation Quality:  Attentive and Sharing  Affect:  Anxious  Cognitive:  Alert and Oriented  Insight:  Developing/Improving  Engagement in Therapy:  Developing/Improving  Modes of Intervention:  Activity, Discussion, Exploration, Rapport Building, Socialization and Support  Summary of Progress/Problems: Topic for today was thoughts and feelings regarding discharge. We discussed fears of upcoming changes including judegements, expectations and stigma of mental health issues. We then discussed supports: what constitutes a supportive framework. As patients processed their anxiety about discharge and described healthy supports patient  Processed how he has avoided sober people that have offered to help due to his feelings of guilt. Patient chose a visual to represent decompensation as stuck/tied down and improvement as "freedom and connection with all that is good."  Carney Bernatherine C Harrill, LCSW

## 2015-07-25 NOTE — Progress Notes (Signed)
Patient did not attend the evening speaker AA meeting. Pt was notified that group was beginning but returned to his room.   

## 2015-07-25 NOTE — Progress Notes (Signed)
D: Patient's self inventory sheet: patient has good sleep, recieved sleep medication.good  Appetite, normal energy level, good concentration. Rated depression 3/10, hopeless 3/10, anxiety 3/10. SI/HI/AVH: denies. Physical complaints are denies. Goal is "staying positive". Plans to work on "no negative thoughts".   A: Medications administered, assessed medication knowledge and education given on medication regimen.  Emotional support and encouragement given patient. R: Denies SI and HI , contracts for safety. Looking forward to discharge. Safety maintained with 15 minute checks.

## 2015-07-26 MED ORDER — ACAMPROSATE CALCIUM 333 MG PO TBEC: 666.0000 mg | DELAYED_RELEASE_TABLET | Freq: Three times a day (TID) | ORAL | Status: AC

## 2015-07-26 MED ORDER — HYDROXYZINE HCL 25 MG PO TABS: 25.0000 mg | ORAL_TABLET | Freq: Four times a day (QID) | ORAL | Status: AC | PRN

## 2015-07-26 MED ORDER — TRAZODONE HCL 100 MG PO TABS: 100.0000 mg | ORAL_TABLET | Freq: Every evening | ORAL | Status: DC | PRN

## 2015-07-26 MED ORDER — TRAZODONE HCL 100 MG PO TABS: 100.0000 mg | ORAL_TABLET | Freq: Every evening | ORAL | Status: AC | PRN

## 2015-07-26 MED ORDER — FLUOXETINE HCL 10 MG PO CAPS: 10.0000 mg | ORAL_CAPSULE | Freq: Every day | ORAL | Status: AC

## 2015-07-26 NOTE — [facility] Group Notes (Signed)
Pt attended spiritual care group on grief and loss facilitated by chaplain Levi Welch   Group opened with brief discussion and psycho-social ed around grief and loss in relationships and in relation to self - identifying life patterns, circumstances, changes that cause losses. Established group norm of speaking from own life experience. Group goal of establishing open and affirming space for members to share loss and experience with grief, normalize grief experience and provide psycho social education and grief support.    Levi Welch was present through much of group.  Alert and oriented x4.  He expresses awareness that he is in a different place this week and is hopeful for his plan to discharge with [facility].    Levi Welch, Levi Welch MDiv

## 2015-07-26 NOTE — [facility] Suicide Risk Assessment (Signed)
[facility] Discharge Suicide Risk Assessment   Principal Problem: Major depressive disorder, recurrent severe without psychotic features [facility]) Discharge Diagnoses:  Patient Active Problem List   Diagnosis Date Noted  . Alcohol use disorder, severe, dependence (HCC) [F10.20] 07/18/2015  . MDD (major depressive disorder), severe (HCC) [F32.2] 07/18/2015  . Major depressive disorder, recurrent severe without psychotic features (HCC) [F33.2] 07/18/2015  . 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 rate 20, height  (1.651 m), weight 143 lb (64.864 kg), SpO2 98 %.Body mass index is 23.8 kg/(m^2).  General Appearance: Well Groomed  Eye Contact::  Good  Speech:  Normal Rate409  Volume:  Normal  Mood:  improved, denies depression, presents euthymic   Affect:  Appropriate and reactive   Thought Process:  Linear  Orientation:  Full (Time, Place, and Person)  Thought Content:  no hallucinations, no delusions, not internally preoccupied   Suicidal Thoughts:  No denies any suicidal or self injurious ideations, denies any homicidal or violent ideations   Homicidal Thoughts:  No  Memory:  recent and remote grossly intact   Judgement:  Other:  improved   Insight:  improved   Psychomotor Activity:  Normal- no tremors, no restlessness   Concentration:  Good  Recall:  Good  Fund of Knowledge:Good  Language: Good  Akathisia:  Negative  Handed:  Right  AIMS (if indicated):     Assets:  Desire for Improvement Resilience  Sleep:  Number of Hours: 6.5  Cognition: WNL  ADL's:  Intact   Mental Status Per Nursing Assessment::   On  Admission:     Demographic Factors:  45 year old single male, no children, lives in a [address] , employed at [employer]   Loss Factors: Facing eviction from his current place of residence   Historical Factors: Alcohol Dependence, Depression   Risk Reduction Factors:   Employed and Positive coping skills or problem solving skills  Continued Clinical Symptoms:  At this time patient is alert, attentive, well related, pleasant, mood improved, denies depression, and presents euthymic, affect appropriate, full range, no thought disorder, no SI or HI, no psychotic symptoms, future oriented.  Denies medication side effects  Cognitive Features That Contribute To Risk:  No gross cognitive deficits noted upon discharge. Is alert , attentive, and oriented x 3   Suicide Risk:  Mild:  Suicidal ideation of limited frequency, intensity, duration, and specificity.  There are no identifiable plans, no associated intent, mild dysphoria and related symptoms, good self-control (both objective and subjective assessment), few other risk factors, and identifiable protective factors, including available and accessible social support.  Follow-up Information    Follow up with [facility].   Why:  Walk in between 8am-9am Monday through Friday for hospital follow-up/medication management/assessment for counseling services.    Contact information:   [address], [place] [address] Phone: [phone] Fax: [phone]      Plan Of Care/Follow-up recommendations:  Activity:  as tolerated  Diet:  Regular  Tests:  NA Other:  see below  Patient is leaving unit in good spirits  Plans to follow up as above  Plans to return to [facility]  House until end of July , after which he states he plans to find another place to live  Nehemiah MassedOBOS, Mabel Roll, MD 07/26/2015, 11:10 AM

## 2015-07-26 NOTE — Progress Notes (Signed)
Patient has been up and active on the unit, attended group this evening and has voiced no complaints. He is hoping to discharge on tomorrow. Patient currently denies having pain, -si/hi/a/v hall. Support and encouragement offered, safety maintained on unit, will continue to monitor.

## 2015-07-26 NOTE — Progress Notes (Signed)
Discharge Note:  Patient discharged with driver.  Patient denied SI and HI.  Denied A/V hallucinations.  Suicide prevention information given and discussed with patient who stated he understood and had no questions.  Patient stated he received all his belongings, clothing, toiletries, misc items, prescriptions, medications.  Patient stated he appreciated all assistance received from [facility] staff.  All required discharge information given to patient at discharge.  

## 2015-07-26 NOTE — Progress Notes (Signed)
  [facility]  Will you be returning to the same living situation after discharge:  Yes,  home At discharge, do you have transportation home?: Yes,  [facility] (Per GoodwinJashella) will pick up pt at 2:00PM Do you have the ability to pay for your medications: Yes,  medication management  Release of information consent forms completed and submitted to medical records by CSW.  Patient to Follow up at: Follow-up Information    Follow up with [facility] On 07/26/2015.   Why:  TCT worker will pick you up today at 2:00PM at discharge to transport you home. Please follow-up with TCT regarding hospital follow-up appt at discharge. Thank you!   Contact information:   [address] [place], [place] [address] Phone: [phone] Fax: [phone]      Next level of care provider has access to [facility] Link:no  Safety Planning and Suicide Prevention discussed: Yes,  [name] completed with pt; pt declined to consent to family contact for [name]. SPI pamphlet and Mobile Crisis information provided to pt and he was encouraged to share information with support network.   Have you used any form of tobacco in the last 30 days? (Cigarettes, Smokeless Tobacco, Cigars, and/or Pipes): No  Has patient been referred to the Quitline?: N/A patient is not a smoker  Patient has been referred for addiction treatment: Yes  Smart, Claud Gowan LCSW 07/26/2015, 11:12 AM

## 2015-07-26 NOTE — Discharge Summary (Signed)
Physician Discharge Summary Note  Patient:  Levi Welch is an 45 y.o., male MRN:  [id number] DOB:  [birth year] Patient phone:  There is no home phone number on file.  Patient address:   [address] [place] [address],  Total Time spent with patient: 30 minutes  Date of Admission:  07/18/2015 Date of Discharge: 07/26/2015  Reason for Admission:  Intoxicated.  Suicide attempt.    Principal Problem: Major depressive disorder, recurrent severe without psychotic features [facility]) Discharge Diagnoses: Patient Active Problem List   Diagnosis Date Noted  . Alcohol use disorder, severe, dependence (HCC) [F10.20] 07/18/2015  . MDD (major depressive disorder), severe (HCC) [F32.2] 07/18/2015  . Major depressive disorder, recurrent severe without psychotic features (HCC) [F33.2] 07/18/2015  . 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 Sleep apnea     Past Surgical History  Procedure Laterality Date  . Carpal tunnel release     Family History: History reviewed. No pertinent family history. Family Psychiatric  History: see HPI Social History:  History  Alcohol Use  . Yes    Comment: 12 40 oz of Icehouse beer weekly     History  Drug Use No    Social History   Social History  . Marital Status: Single    Spouse Name: N/A  . Number of Children: N/A  . Years of Education: N/A   Social History Main Topics  . Smoking status: Never Smoker   . Smokeless tobacco: None  . Alcohol Use: Yes     Comment: 12 40 oz of Icehouse beer weekly  . Drug Use: No  . Sexual Activity: Not Asked   Other Topics Concern  . None   Social History Narrative    Hospital Course:  Giankarlo Leamer, 45 y.o. Admitted to [facility].  Patient was very intoxicated and tearful according to admission assessment. Patient reports he ingested eight tabs of Valerian root and  drank an excessive amount of alcohol in a suicide attempt.   Christine Schiefelbein was admitted for Major depressive disorder, recurrent severe without psychotic features (HCC) and crisis management.  He was treated with Campral 666 mg for alcoholism, Prozac 10 mg depression and Librium protocol for withdrawal symptoms.  Medical problems were identified and treated as needed.  Home medications were restarted as appropriate.  Improvement was monitored by observation and Desiree Hane daily report of symptom reduction.  Emotional and mental status was monitored by daily self inventory reports completed by Desiree Hane and clinical staff.  Patient reported continued improvement, denied any new concerns.  Patient had been compliant on medications and denied side effects.  Support and encouragement was provided.    At time of discharge, patient rated both depression and anxiety levels to be manageable and minimal.  Patient encouraged to attend groups to help with recognizing triggers of emotional crises and de-stabilizations.  Patient encouraged to attend group to help identify the positive things in life that would help in dealing with feelings of loss, depression and unhealthy or abusive tendencies.         Damarrion Mimbs was evaluated by the treatment team for stability and plans for continued recovery upon discharge.  He was offered further treatment options upon discharge including Residential, Intensive Outpatient and Outpatient treatment.  He will follow up with agencies listed below for medication management and counseling.  Encouraged  patient to maintain satisfactory support network and home environment.  Advised to adhere to medication compliance and outpatient treatment follow up.  Prescriptions provided.       Rogerio Boutelle motivation was an integral factor for scheduling further treatment.  Employment, transportation, bed availability, health status, family support, and any pending legal issues  were also considered during his hospital stay.  Upon completion of this admission the patient was both mentally and medically stable for discharge denying suicidal/homicidal ideation, auditory/visual/tactile hallucinations, delusional thoughts and paranoia.      Physical Findings: AIMS: Facial and Oral Movements Muscles of Facial Expression: None, normal Lips and Perioral Area: None, normal Jaw: None, normal Tongue: None, normal,Extremity Movements Upper (arms, wrists, hands, fingers): None, normal Lower (legs, knees, ankles, toes): None, normal, Trunk Movements Neck, shoulders, hips: None, normal, Overall Severity Severity of abnormal movements (highest score from questions above): None, normal Incapacitation due to abnormal movements: None, normal Patient's awareness of abnormal movements (rate only patient's report): No Awareness, Dental Status Current problems with teeth and/or dentures?: No Does patient usually wear dentures?: No  CIWA:  CIWA-Ar Total: 1 COWS:  COWS Total Score: 2  Musculoskeletal: Strength & Muscle Tone: within normal limits Gait & Station: normal Patient leans: N/A  Psychiatric Specialty Exam:  SEE MD SRA Physical Exam  ROS  Blood pressure 120/78, pulse 84, temperature 97.6 F (36.4 C), temperature source Oral, resp. rate 20, height 5\' 5"  (1.651 m), weight 64.864 kg (143 lb), SpO2 98 %.Body mass index is 23.8 kg/(m^2).   Have you used any form of tobacco in the last 30 days? (Cigarettes, Smokeless Tobacco, Cigars, and/or Pipes): No  Has this patient used any form of tobacco in the last 30 days? (Cigarettes, Smokeless Tobacco, Cigars, and/or Pipes)   N/A  Blood Alcohol level:  Lab Results  Component Value Date   ETH 386* 07/17/2015    Metabolic Disorder Labs:  No results found for: HGBA1C, MPG No results found for: PROLACTIN No results found for: CHOL, TRIG, HDL, CHOLHDL, VLDL, LDLCALC  See Psychiatric Specialty Exam and Suicide Risk Assessment  completed by Attending Physician prior to discharge.  Discharge destination:  Home  Is patient on multiple antipsychotic therapies at discharge:  No   Has Patient had three or more failed trials of antipsychotic monotherapy by history:  No  Recommended Plan for Multiple Antipsychotic Therapies: NA     Medication List    TAKE these medications      Indication   FLUoxetine 10 MG capsule  Commonly known as:  PROZAC  Take 1 capsule (10 mg total) by mouth daily.   Indication:  Excessive Use of Alcohol, Depression     hydrOXYzine 25 MG tablet  Commonly known as:  ATARAX/VISTARIL  Take 1 tablet (25 mg total) by mouth every 6 (six) hours as needed for anxiety.   Indication:  Anxiety Neurosis     traZODone 100 MG tablet  Commonly known as:  DESYREL  Take 1 tablet (100 mg total) by mouth at bedtime and may repeat dose one time if needed.   Indication:  Trouble Sleeping       Follow-up Information    Follow up with [facility] On 07/26/2015.   Why:  TCT worker will pick you up today at 2:00PM at discharge to transport you home. Please follow-up with TCT regarding hospital follow-up appt at discharge. Thank you!   Contact information:   [address], [place] [address] Phone: [phone] Fax: [phone]  Follow-up recommendations:  Activity:  as tol Diet:  as tol  Comments:  1.  Take all your medications as prescribed.   2.  Report any adverse side effects to outpatient provider. 3.  Patient instructed to not use alcohol or illegal drugs while on prescription medicines. 4.  In the event of worsening symptoms, instructed patient to call 911, the crisis hotline or go to nearest emergency room for evaluation of symptoms.  Signed: Lindwood QuaSheila May Agustin, NP [facility] 07/26/2015, 12:54 PM  Patient seen, Suicide Assessment Completed.  Disposition Plan Reviewed

## 2015-07-26 NOTE — Progress Notes (Signed)
Recreation Therapy Notes  Date: 07.17.2017 Time: 9:30am Location: [address] Group Room   Group Topic: Stress Management  Goal Area(s) Addresses:  Patient will actively participate in stress management techniques presented during session.   Behavioral Response: Engaged, Attentive   Intervention: Stress management techniques  Activity :  Deep Breathing, Mindfulness and Mindful Breathing. LRT provided education, instruction and demonstration on practice of Deep Breathing, Mindfulness and Mindful Breathing. Patient was asked to participate in technique introduced during session.   Education:  Stress Management, Discharge Planning.   Education Outcome: Acknowledges education  Clinical Observations/Feedback: Patient actively engaged in technique introduced, expressed no concerns and demonstrated ability to practice independently post d/c.   Levi Welch, LRT/CTRS        Levi Welch L 07/26/2015 1:32 PM 

## 2015-07-26 NOTE — Progress Notes (Signed)
D:  Patient's self inventory sheet, patient sleeps good, sleep medication is helpful.  Good appetite, normal energy level, good concentration.  Rated depression, hopeless and anxiety #3.  Withdrawals.  Denied SI.  Denied physical problems.  Denied pain.  Goal is no negative thoughts.  Plans to stay positive.  Does have discharge plans. A:  Medications administered per MD orders.  Emotional support and encouragement given patient. R:  Denied SI and HI, contracts for safety.  Denied A/V hallucinations.  Safety maintained with 15 minute checks.

## 2015-07-26 NOTE — Tx Team (Signed)
Interdisciplinary Treatment Plan Update (Adult) Date: 07/26/2015    Time Reviewed: 9:30 AM  Progress in Treatment: Attending groups: Yes Participating in groups: Yes Taking medication as prescribed: Yes Tolerating medication: Yes Family/Significant other contact made: [name] completed with pt; pt declined to consent to family contact.  Patient understands diagnosis: Yes Discussing patient identified problems/goals with staff: Yes Medical problems stabilized or resolved: Yes Denies suicidal/homicidal ideation: Yes Issues/concerns per patient self-inventory: Yes Other:  New problem(s) identified: N/A  Discharge Plan or Barriers: Home with outpatient services at [facility]. Pt is being followed by TCT and they will provide pt with transportation home today. Pt plans to continue to follow-up with Levi Welch at discharge. He was also given: [employer] pamphlet and boarding house/extended stay hotel list.   Reason for Continuation of Hospitalization:  none  Comments: N/A  Estimated length of stay: d/c today   Patient is a 45 year old male admitted with suicidal ideations, depression, and alcohol abuse. Pt reports primary trigger(s) for admission include eviction from boarding house due to difficulty paying rent, and financial stressors. Patient plans to return to previous living situation temporarily before locating alternative housing. He plans to follow up with [facility]. Patient will benefit from crisis stabilization, medication evaluation, group therapy and psycho education in addition to case management for discharge planning. At discharge, it is recommended that Pt remain compliant with established discharge plan and continued treatment.   Review of initial/current patient goals per problem list:  1. Goal(s): Patient will participate in aftercare plan   Met: Yes   Target date: 3-5 days post admission date   As evidenced by: Patient will participate within aftercare plan  AEB aftercare provider and housing plan at discharge being identified.  7/10: Goal met. Patient plans to return home to follow up with outpatient services.    2. Goal (s): Patient will exhibit decreased depressive symptoms and suicidal ideations.   Met: Yes-adequate for discharge    Target date: 3-5 days post admission date   As evidenced by: Patient will utilize self rating of depression at 3 or below and demonstrate decreased signs of depression or be deemed stable for discharge by MD.  7/10: Goal progressing. Patient admitted with high depression level.  7/14: Goal met/adequate for discharge. Pt rates depression as 4 and presents with pleasant mood/calm affect. Denies SI/HI/AVH. He appears to be presenting at his baseline.    3. Goal(s): Patient will demonstrate decreased signs of withdrawal due to substance abuse   Met: Yes   Target date: 3-5 days post admission date   As evidenced by: Patient will produce a CIWA/COWS score of 0, have stable vitals signs, and no symptoms of withdrawal  7/10: Goal not met: Pt continues to have withdrawal symptoms of headache, anxiety, and sweating and a CIWA score of a 3.  Pt to show decrease withdrawal symptoms prior to d/c.  7/14: Goal met. Pt reports no signs of withdrawal with CIWA/COWS of 0. High BP. Per MD, pt is medically stable for discharge. All other vitals are stable.     Attendees: Patient:    Family:    Physician: Dr. Elenora Fender 07/26/2015 11:13 AM   Nursing: Precious Gilding RN  07/26/2015 11:13 AM   Clinical Social Worker: Erasmo Downer Drinkard, LCSW 07/26/2015 11:13 AM   Other: Peri Maris, LCSWA 07/26/2015 11:13 AM   Other: Maxie Better, LCSW 07/26/2015 11:13 AM   Other:    Other: Agustina Caroli NP  07/26/2015 11:13 AM   Other:  Other:         Scribe for Treatment Team:  Maxie Better, MSW, LCSW Clinical Social Worker 07/26/2015 11:13 AM

## 2015-11-20 ENCOUNTER — Emergency Department (HOSPITAL_COMMUNITY): Payer: No Typology Code available for payment source

## 2015-11-20 ENCOUNTER — Encounter (HOSPITAL_COMMUNITY): Payer: Self-pay | Admitting: Emergency Medicine

## 2015-11-20 ENCOUNTER — Emergency Department (HOSPITAL_COMMUNITY)
Admission: EM | Admit: 2015-11-20 | Discharge: 2015-11-21 | Disposition: A | Discharge status: Home / Self Care | Payer: No Typology Code available for payment source | Attending: Emergency Medicine | Admitting: Emergency Medicine

## 2015-11-20 DIAGNOSIS — R933 Abnormal findings on diagnostic imaging of other parts of digestive tract: Secondary | ICD-10-CM | POA: Diagnosis not present

## 2015-11-20 DIAGNOSIS — Y9301 Activity, walking, marching and hiking: Secondary | ICD-10-CM | POA: Diagnosis not present

## 2015-11-20 DIAGNOSIS — S90812A Abrasion, left foot, initial encounter: Secondary | ICD-10-CM | POA: Diagnosis not present

## 2015-11-20 DIAGNOSIS — F17210 Nicotine dependence, cigarettes, uncomplicated: Secondary | ICD-10-CM | POA: Diagnosis not present

## 2015-11-20 DIAGNOSIS — T1490XA Injury, unspecified, initial encounter: Secondary | ICD-10-CM

## 2015-11-20 DIAGNOSIS — R931 Abnormal findings on diagnostic imaging of heart and coronary circulation: Secondary | ICD-10-CM | POA: Diagnosis not present

## 2015-11-20 DIAGNOSIS — S0990XA Unspecified injury of head, initial encounter: Secondary | ICD-10-CM | POA: Diagnosis present

## 2015-11-20 DIAGNOSIS — S0101XA Laceration without foreign body of scalp, initial encounter: Secondary | ICD-10-CM | POA: Insufficient documentation

## 2015-11-20 DIAGNOSIS — Z5181 Encounter for therapeutic drug level monitoring: Secondary | ICD-10-CM | POA: Diagnosis not present

## 2015-11-20 DIAGNOSIS — Z23 Encounter for immunization: Secondary | ICD-10-CM | POA: Diagnosis not present

## 2015-11-20 DIAGNOSIS — F10229 Alcohol dependence with intoxication, unspecified: Secondary | ICD-10-CM | POA: Diagnosis not present

## 2015-11-20 DIAGNOSIS — Y999 Unspecified external cause status: Secondary | ICD-10-CM | POA: Diagnosis not present

## 2015-11-20 DIAGNOSIS — Y92410 Unspecified street and highway as the place of occurrence of the external cause: Secondary | ICD-10-CM | POA: Diagnosis not present

## 2015-11-20 DIAGNOSIS — Z9889 Other specified postprocedural states: Secondary | ICD-10-CM | POA: Diagnosis not present

## 2015-11-20 DIAGNOSIS — F10129 Alcohol abuse with intoxication, unspecified: Secondary | ICD-10-CM | POA: Diagnosis present

## 2015-11-20 DIAGNOSIS — Z79899 Other long term (current) drug therapy: Secondary | ICD-10-CM | POA: Diagnosis not present

## 2015-11-20 HISTORY — DX: Neurofibromatosis, unspecified: Q85.00

## 2015-11-20 HISTORY — DX: Alcohol abuse, uncomplicated: F10.10

## 2015-11-20 HISTORY — DX: Childhood onset fluency disorder: F80.81

## 2015-11-20 LAB — COMPREHENSIVE METABOLIC PANEL
ALBUMIN: 3.8 g/dL (ref 3.5–5.0)
ALT: 16 U/L — ABNORMAL LOW (ref 17–63)
AST: 25 U/L (ref 15–41)
Alkaline Phosphatase: 54 U/L (ref 38–126)
Anion gap: 16 — ABNORMAL HIGH (ref 5–15)
BUN: 8 mg/dL (ref 6–20)
CALCIUM: 8.7 mg/dL — AB (ref 8.9–10.3)
CO2: 18 mmol/L — AB (ref 22–32)
CREATININE: 0.87 mg/dL (ref 0.61–1.24)
Chloride: 103 mmol/L (ref 101–111)
GFR calc Af Amer: >60 mL/min (ref >60)
GLUCOSE: 111 mg/dL — AB (ref 65–99)
POTASSIUM: 3.2 mmol/L — AB (ref 3.5–5.1)
SODIUM: 137 mmol/L (ref 135–145)
TOTAL PROTEIN: 5.7 g/dL — AB (ref 6.5–8.1)
Total Bilirubin: 0.7 mg/dL (ref 0.3–1.2)

## 2015-11-20 LAB — I-STAT CHEM 8, ED
BUN: 9 mg/dL (ref 6–20)
Calcium, Ion: 1 mmol/L — ABNORMAL LOW (ref 1.15–1.40)
Chloride: 103 mmol/L (ref 101–111)
Creatinine, Ser: 1.1 mg/dL (ref 0.61–1.24)
Glucose, Bld: 111 mg/dL — ABNORMAL HIGH (ref 65–99)
HEMATOCRIT: 53 % — AB (ref 39.0–52.0)
HEMOGLOBIN: 18 g/dL — AB (ref 13.0–17.0)
Potassium: 3.1 mmol/L — ABNORMAL LOW (ref 3.5–5.1)
SODIUM: 139 mmol/L (ref 135–145)
TCO2: 20 mmol/L (ref 0–100)

## 2015-11-20 LAB — ETHANOL: ALCOHOL ETHYL (B): 302 mg/dL — AB (ref <5)

## 2015-11-20 LAB — CBC
HCT: 51.2 % (ref 39.0–52.0)
HEMOGLOBIN: 17.5 g/dL — AB (ref 13.0–17.0)
MCH: 31.2 pg (ref 26.0–34.0)
MCHC: 34.2 g/dL (ref 30.0–36.0)
MCV: 91.3 fL (ref 78.0–100.0)
PLATELETS: 277 10*3/uL (ref 150–400)
RBC: 5.61 MIL/uL (ref 4.22–5.81)
RDW: 13.3 % (ref 11.5–15.5)
WBC: 11.6 10*3/uL — ABNORMAL HIGH (ref 4.0–10.5)

## 2015-11-20 LAB — SAMPLE TO BLOOD BANK

## 2015-11-20 LAB — I-STAT CG4 LACTIC ACID, ED: LACTIC ACID, VENOUS: 4.44 mmol/L — AB (ref 0.5–1.9)

## 2015-11-20 LAB — PROTIME-INR
INR: 0.92
Prothrombin Time: 12.3 seconds (ref 11.4–15.2)

## 2015-11-20 LAB — CDS SEROLOGY

## 2015-11-20 MED ORDER — TETANUS-DIPHTH-ACELL PERTUSSIS 5-2.5-18.5 LF-MCG/0.5 IM SUSP
0.5000 mL | Freq: Once | INTRAMUSCULAR | Status: AC
  Administered 2015-11-21: 0.5 mL via INTRAMUSCULAR
  Filled 2015-11-20: qty 0.5

## 2015-11-20 MED ORDER — SODIUM CHLORIDE 0.9 % IV BOLUS (SEPSIS)
1000.0000 mL | Freq: Once | INTRAVENOUS | Status: AC
  Administered 2015-11-20: 1000 mL via INTRAVENOUS

## 2015-11-20 MED ORDER — IOPAMIDOL (ISOVUE-300) INJECTION 61%
INTRAVENOUS | Status: AC
Start: 2015-11-20 — End: 2015-11-20
  Administered 2015-11-20: 100 mL
  Filled 2015-11-20: qty 100

## 2015-11-20 MED ORDER — MORPHINE SULFATE (PF) 4 MG/ML IV SOLN
4.0000 mg | Freq: Once | INTRAVENOUS | Status: AC
  Administered 2015-11-20: 4 mg via INTRAVENOUS
  Filled 2015-11-20: qty 1
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
## 2015-11-20 NOTE — Progress Notes (Signed)
   11/20/15 2150  Clinical Encounter Type  Visited With Health care provider  Visit Type ED  Referral From Other (Comment) (Level 2)  Spiritual Encounters  Spiritual Needs Emotional  Stress Factors  Patient Stress Factors Health changes  Inquired about family. Pt declined.
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
## 2015-11-20 NOTE — Progress Notes (Signed)
Orthopedic Tech Progress Note Patient Details:  Desiree HaneMichael Fuquay [birth year] [id number] Level 2 trauma ortho visit. Patient ID: Desiree HaneMichael Duce, male   DOB: [birth year], [age] y.o.   MRN: [id number]   Jennye MoccasinHughes, Kainen Struckman Craig 11/20/2015, 10:03 PM

## 2015-11-21 ENCOUNTER — Encounter (HOSPITAL_COMMUNITY): Payer: Self-pay | Admitting: *Deleted

## 2015-11-21 DIAGNOSIS — F10129 Alcohol abuse with intoxication, unspecified: Secondary | ICD-10-CM | POA: Diagnosis present

## 2015-11-21 DIAGNOSIS — Z9889 Other specified postprocedural states: Secondary | ICD-10-CM

## 2015-11-21 DIAGNOSIS — Z880 Allergy status to penicillin: Secondary | ICD-10-CM

## 2015-11-21 DIAGNOSIS — F10229 Alcohol dependence with intoxication, unspecified: Secondary | ICD-10-CM

## 2015-11-21 DIAGNOSIS — S0101XA Laceration without foreign body of scalp, initial encounter: Secondary | ICD-10-CM | POA: Diagnosis not present

## 2015-11-21 DIAGNOSIS — F17210 Nicotine dependence, cigarettes, uncomplicated: Secondary | ICD-10-CM | POA: Diagnosis not present

## 2015-11-21 DIAGNOSIS — Z79899 Other long term (current) drug therapy: Secondary | ICD-10-CM

## 2015-11-21 LAB — URINE MICROSCOPIC-ADD ON

## 2015-11-21 LAB — URINALYSIS, ROUTINE W REFLEX MICROSCOPIC
Bilirubin Urine: NEGATIVE
GLUCOSE, UA: NEGATIVE mg/dL
Ketones, ur: NEGATIVE mg/dL
LEUKOCYTES UA: NEGATIVE
Nitrite: NEGATIVE
PH: 5.5 (ref 5.0–8.0)
PROTEIN: NEGATIVE mg/dL
SPECIFIC GRAVITY, URINE: 1.023 (ref 1.005–1.030)

## 2015-11-21 LAB — I-STAT CG4 LACTIC ACID, ED: Lactic Acid, Venous: 1.72 mmol/L (ref 0.5–1.9)

## 2015-11-21 LAB — ETHANOL: Alcohol, Ethyl (B): 124 mg/dL — ABNORMAL HIGH (ref <5)

## 2015-11-21 MED ORDER — POTASSIUM CHLORIDE CRYS ER 20 MEQ PO TBCR
40.0000 meq | EXTENDED_RELEASE_TABLET | Freq: Once | ORAL | Status: AC
Start: 2015-11-21 — End: 2015-11-21
  Administered 2015-11-21: 40 meq via ORAL
  Filled 2015-11-21: qty 2

## 2015-11-21 MED ORDER — SODIUM CHLORIDE 0.9 % IV BOLUS (SEPSIS)
1000.0000 mL | Freq: Once | INTRAVENOUS | Status: AC
  Administered 2015-11-21: 1000 mL via INTRAVENOUS

## 2015-11-21 MED ORDER — IBUPROFEN 400 MG PO TABS
600.0000 mg | ORAL_TABLET | Freq: Three times a day (TID) | ORAL | Status: DC | PRN
  Administered 2015-11-21: 600 mg via ORAL
  Filled 2015-11-21: qty 1

## 2015-11-21 MED ORDER — THIAMINE HCL 100 MG/ML IJ SOLN: 100.0000 mg | Freq: Every day | INTRAMUSCULAR | Status: DC

## 2015-11-21 MED ORDER — ZOLPIDEM TARTRATE 5 MG PO TABS: 5.0000 mg | ORAL_TABLET | Freq: Every evening | ORAL | Status: DC | PRN

## 2015-11-21 MED ORDER — LORAZEPAM 1 MG PO TABS: 0.0000 mg | ORAL_TABLET | Freq: Two times a day (BID) | ORAL | Status: DC

## 2015-11-21 MED ORDER — LORAZEPAM 1 MG PO TABS: 0.0000 mg | ORAL_TABLET | Freq: Four times a day (QID) | ORAL | Status: DC

## 2015-11-21 MED ORDER — VITAMIN B-1 100 MG PO TABS
100.0000 mg | ORAL_TABLET | Freq: Every day | ORAL | Status: DC
  Administered 2015-11-21: 100 mg via ORAL
  Filled 2015-11-21: qty 1

## 2015-11-21 MED ORDER — ONDANSETRON HCL 4 MG PO TABS: 4.0000 mg | ORAL_TABLET | Freq: Three times a day (TID) | ORAL | Status: DC | PRN

## 2015-11-21 MED ORDER — LIDOCAINE HCL (PF) 1 % IJ SOLN
5.0000 mL | Freq: Once | INTRAMUSCULAR | Status: AC
  Administered 2015-11-21: 5 mL

## 2015-11-21 MED ORDER — ALUM & MAG HYDROXIDE-SIMETH 200-200-20 MG/5ML PO SUSP: 30.0000 mL | ORAL | Status: DC | PRN

## 2015-11-21 MED ORDER — LORAZEPAM 1 MG PO TABS: 1.0000 mg | ORAL_TABLET | Freq: Three times a day (TID) | ORAL | Status: DC | PRN

## 2015-11-21 MED ORDER — NICOTINE 21 MG/24HR TD PT24: 21.0000 mg | MEDICATED_PATCH | Freq: Every day | TRANSDERMAL | Status: DC

## 2015-11-21 MED ORDER — ACETAMINOPHEN 325 MG PO TABS: 650.0000 mg | ORAL_TABLET | ORAL | Status: DC | PRN

## 2015-11-21 NOTE — ED Notes (Signed)
Telepsych being performed. 

## 2015-11-21 NOTE — Discharge Instructions (Signed)
FOLLOW INSTRUCTIONS GIVEN TO YOU BY THE PSYCHIATRIC PHYSICIANS
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
## 2015-11-21 NOTE — ED Notes (Signed)
Lunch order request received. Sprite given as requested for snack.

## 2015-11-21 NOTE — ED Notes (Signed)
C-collar removed per Dr. Ladona RidgelGaddy.
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
## 2015-11-21 NOTE — ED Notes (Signed)
Breakfast has been ordered for patient.

## 2015-11-21 NOTE — BH Assessment (Signed)
Called to have tele-assessment cart placed in pt room for assessment. Verlon AuLeslie stated to call back in 15 minutes so cart could be located and placed in pt's room.   Orlie PollenAshley Jayra Choyce, LPC, NCC,  [facility] Therapeutic Triage Specialist  11/21/2015 12:01 AM
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
## 2015-11-21 NOTE — BH Assessment (Signed)
Levi ConnJason Berry, NP recommends a morning psychiatric evaluation before confirming inpatient treatment for pt.  TTS will wait to seek placement if needed after the morning evaluation by psychiatry.   Orlie PollenAshley Julian Medina, LPC, NCC,  LCAS-A Therapeutic Triage Specialist  11/21/2015 2:13 AM

## 2015-11-21 NOTE — BH Assessment (Addendum)
Notified Dr. Deirdre PeerJeremiah Gaddy about pt's disposition of meeting inpatient criteria per Nira ConnJason Berry, NP.  Chyrl CivatteJoAnn, [facility]. TTS will continue to seek placement.   Orlie PollenAshley Camyah Pultz, LPC, NCC,  LCAS-A Therapeutic Triage Specialist  11/21/2015 1:31 AM

## 2015-11-21 NOTE — ED Notes (Addendum)
Blue scrubs, bus pass, and worknote given. Voiced understanding to have sutures/staples removed in 10 days.

## 2015-11-22 ENCOUNTER — Encounter (HOSPITAL_COMMUNITY): Payer: Self-pay | Admitting: Psychiatry

## 2015-12-04 ENCOUNTER — Encounter (HOSPITAL_COMMUNITY): Payer: Self-pay | Admitting: Emergency Medicine

## 2015-12-04 ENCOUNTER — Emergency Department (HOSPITAL_COMMUNITY)
Admission: EM | Admit: 2015-12-04 | Discharge: 2015-12-04 | Disposition: A | Discharge status: Home / Self Care | Payer: Self-pay | Attending: Emergency Medicine | Admitting: Emergency Medicine

## 2015-12-04 ENCOUNTER — Emergency Department (HOSPITAL_COMMUNITY): Payer: Self-pay

## 2015-12-04 DIAGNOSIS — Z4802 Encounter for removal of sutures: Secondary | ICD-10-CM | POA: Insufficient documentation

## 2015-12-04 DIAGNOSIS — F17210 Nicotine dependence, cigarettes, uncomplicated: Secondary | ICD-10-CM | POA: Insufficient documentation

## 2015-12-04 DIAGNOSIS — Z79899 Other long term (current) drug therapy: Secondary | ICD-10-CM | POA: Insufficient documentation

## 2015-12-04 DIAGNOSIS — M25572 Pain in left ankle and joints of left foot: Secondary | ICD-10-CM | POA: Insufficient documentation

## 2015-12-04 MED ORDER — BACITRACIN ZINC 500 UNIT/GM EX OINT
TOPICAL_OINTMENT | CUTANEOUS | Status: AC
  Administered 2015-12-04: 1
  Filled 2015-12-04: qty 1.8
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
## 2015-12-04 NOTE — Discharge Instructions (Signed)
FOLLOW UP WITH YOUR DOCTOR FOR RECHECK IF PAIN IN LEFT ANKLE PERSISTS.
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
